# Patient Record
Sex: Female | Born: 1960 | Race: White | Hispanic: Yes | Marital: Married | Smoking: Never smoker
Health system: Southern US, Community
[De-identification: ages and names within clinical notes are randomized; demographics above are authoritative.]

## PROBLEM LIST (undated history)

## (undated) HISTORY — PX: ABDOMINAL HYSTERECTOMY: SHX81

## (undated) HISTORY — PX: COLONOSCOPY: SHX174

---

## 2005-08-24 ENCOUNTER — Ambulatory Visit: Payer: Self-pay | Admitting: Family Medicine

## 2008-06-10 ENCOUNTER — Other Ambulatory Visit: Payer: Self-pay

## 2008-06-10 ENCOUNTER — Emergency Department: Payer: Self-pay | Admitting: Emergency Medicine

## 2008-06-12 ENCOUNTER — Ambulatory Visit: Payer: Self-pay | Admitting: Emergency Medicine

## 2008-07-19 ENCOUNTER — Ambulatory Visit: Payer: Self-pay | Admitting: Family Medicine

## 2009-12-08 ENCOUNTER — Ambulatory Visit: Payer: Self-pay | Admitting: Family Medicine

## 2012-01-25 ENCOUNTER — Ambulatory Visit: Payer: Self-pay | Admitting: Obstetrics and Gynecology

## 2014-06-10 DIAGNOSIS — N814 Uterovaginal prolapse, unspecified: Secondary | ICD-10-CM | POA: Insufficient documentation

## 2014-08-21 ENCOUNTER — Ambulatory Visit: Payer: Self-pay | Admitting: Obstetrics and Gynecology

## 2014-12-16 ENCOUNTER — Ambulatory Visit: Payer: Self-pay | Admitting: Obstetrics and Gynecology

## 2014-12-20 ENCOUNTER — Ambulatory Visit: Payer: Self-pay | Admitting: Obstetrics and Gynecology

## 2015-02-03 LAB — SURGICAL PATHOLOGY

## 2015-02-09 NOTE — Op Note (Signed)
PATIENT NAME:  Valerie [name], Valerie [name] MR#:  [id number] DATE OF BIRTH:  1961/04/08  DATE OF PROCEDURE:  12/20/2014  PREOPERATIVE DIAGNOSES: Uterine descensus.   POSTOPERATIVE DIAGNOSES: Uterine descensus.   PROCEDURE:  1. Total vaginal hysterectomy.  2. Bilateral salpingectomy.  3. Retrograde filling of the bladder.  4. Cystoscopy.   ANESTHESIA: General endotracheal anesthesia.   SURGEON: Suzy Bouchardhomas J. Schermerhorn, M.D.   FIRST ASSISTANT: Dalbert GarnetBeasley.   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 The patient is status post 2 vaginal births.   PROCEDURE: After adequate general endotracheal anesthesia, the patient was placed in the dorsal supine position with the legs placed in the candy cane stirrups. Abdomen, perineum, and vagina were prepped and draped in normal sterile fashion. The patient previously received 2 grams intravenous cefoxitin prior to commencement of the case. The patient was sterilely draped and a red Robinson catheter was placed into the bladder, yielding 150 mL clear urine. A weighted speculum was placed in the posterior vaginal vault and sidewall retractors were placed laterally and Sims retractor was placed anteriorly and the cervix was grasped with 2 thyroid tenacula. The cervix was circumferentially injected with 1% lidocaine with 1:100,000 epinephrine. A direct posterior colpotomy incision was made. Upon entry into the posterior cul-de-sac, the uterosacral ligaments were bilaterally clamped, transected, and suture ligated with 0 Vicryl suture and tagged for later identification. The lateral and anterior cervix was incised with the Bovie and the cardinal ligaments were then bilaterally clamped, transected, and suture ligated with 0 Vicryl suture. The anterior cul-de-sac was not entered with ease. There was much adhesion between the bladder and the anterior portion of the cervix, therefore, serial bites were performed laterally,  transected, and suture ligated with 0 Vicryl suture. Ultimately, the uterus was then flipped posteriorly in a Doderlein maneuver and the uterine cornua were bilaterally clamped, transected, suture ligated and the broad ligament was then sequentially clamped bilaterally down to the level of the uterine arteries, which were ultimately bilaterally clamped, transected, and suture ligated. Throughout the case, uterine sound was placed into the bladder and aided in the position of the bladder. Ultimately, the cervix and uterus were re-flipped back into anatomic position and sequential broad ligament bites were made and ultimately the uterus was delivered without difficulty. The fallopian tubes were both identified, grasped with Babcock clamps, and a Kelly clamp was placed over each fallopian tube and was removed with Metzenbaum scissors. Each pedicle was ligated with 0 Vicryl suture. Good hemostasis was noted. Each ovary appeared normal. Given the amount of dissection and manipulation of the bladder, the bladder was then filled in a retrograde fashion with methylene blue and sterile water, 300 mL was placed and no leakage of methylene blue was noted into the vagina. This was aided by the use of a sponge stick. Of note, before the retrograde procedure was performed, a re-catheterization yielded clear urine. At this point, the cystoscopy was brought up to the operative field to evaluate ureteral function. Bladder was filled with sterile saline 300 mL and 20 mL of methylene blue was administered intravenously while surgeon evaluated the ureteral ostia. An additional 20 mL was utilized, still with no efflux, even though normal clear urine was noted peristalsing through the ureters. 10 mg of Lasix was administered as well to aid in pushing the methylene blue into the kidneys and thus into the bladder. This was still unsuccessful. 1 mL of Fluorescein was then administered intravenously and within  2 minutes, prompt flow of  Fluorescein was identified and efflux seen from both ureteral ostia. Cystoscope was removed and a Foley catheter was replaced. The peritoneum was then closed with a 2-0 PDS suture in a pursestring fashion after assuring all pedicles were hemostatic. The vaginal cuff was then closed with the running 0 Vicryl suture with good approximation of edges. Good hemostasis was noted. Again, the Foley catheter was attached with normal bright yellow, Fluorescein-stained urine noted.   ESTIMATED BLOOD LOSS: 350 mL.   URINE OUTPUT: Approximately 200 mL.   INTRAOPERATIVE FLUIDS: 2000 mL.  The patient did tolerate the procedure well and was taken to the recovery room in good condition.  ____________________________ Suzy Bouchard, MD tjs:ap D: 12/20/2014 14:17:58 ET T: 12/20/2014 21:33:15 ET JOB#: [id number]  cc: Suzy Bouchard, MD, <Dictator> Suzy Bouchard MD ELECTRONICALLY SIGNED 01/01/2015 8:46

## 2015-07-21 ENCOUNTER — Other Ambulatory Visit: Payer: Self-pay | Admitting: Obstetrics and Gynecology

## 2015-07-21 DIAGNOSIS — Z1231 Encounter for screening mammogram for malignant neoplasm of breast: Secondary | ICD-10-CM

## 2015-08-27 ENCOUNTER — Ambulatory Visit
Admission: RE | Admit: 2015-08-27 | Discharge: 2015-08-27 | Disposition: A | Discharge status: Home / Self Care | Payer: PRIVATE HEALTH INSURANCE | Source: Ambulatory Visit | Attending: Obstetrics and Gynecology | Admitting: Obstetrics and Gynecology

## 2015-08-27 DIAGNOSIS — Z1231 Encounter for screening mammogram for malignant neoplasm of breast: Secondary | ICD-10-CM | POA: Insufficient documentation

## 2015-08-29 ENCOUNTER — Other Ambulatory Visit: Payer: Self-pay | Admitting: Obstetrics and Gynecology

## 2015-08-29 DIAGNOSIS — R928 Other abnormal and inconclusive findings on diagnostic imaging of breast: Secondary | ICD-10-CM

## 2015-09-12 ENCOUNTER — Ambulatory Visit
Admission: RE | Admit: 2015-09-12 | Discharge: 2015-09-12 | Disposition: A | Discharge status: Home / Self Care | Payer: PRIVATE HEALTH INSURANCE | Source: Ambulatory Visit | Attending: Obstetrics and Gynecology | Admitting: Obstetrics and Gynecology

## 2015-09-12 DIAGNOSIS — R928 Other abnormal and inconclusive findings on diagnostic imaging of breast: Secondary | ICD-10-CM

## 2015-09-22 ENCOUNTER — Ambulatory Visit: Payer: Self-pay | Admitting: Physician Assistant

## 2015-09-22 VITALS — BP 110/65 | HR 106 | Temp 98.0°F

## 2015-09-22 DIAGNOSIS — J069 Acute upper respiratory infection, unspecified: Secondary | ICD-10-CM

## 2015-09-22 MED ORDER — FLUCONAZOLE 150 MG PO TABS: 150.0000 mg | ORAL_TABLET | Freq: Once | ORAL | Status: DC

## 2015-09-22 MED ORDER — FLUTICASONE PROPIONATE 50 MCG/ACT NA SUSP: 2.0000 | Freq: Every day | NASAL | Status: DC

## 2015-09-22 MED ORDER — AZITHROMYCIN 250 MG PO TABS: ORAL_TABLET | ORAL | Status: DC

## 2015-09-22 NOTE — Progress Notes (Signed)
S: C/o runny nose and congestion for 3 days, no fever, chills, cp/sob, v/d; mucus is green and thick, cough is sporadic, c/o of facial and dental pain. Also Saturday started with anal itching, is relieved by preparation H, no bleeding with bm;   Using otc meds:   O: PE: perrl eomi, normocephalic, tms dull, nasal mucosa red and swollen, throat injected, neck supple no lymph, lungs c t a, cv rrr, neuro intact, rectal exam deferred by pt  A:  Acute sinusitis, anal itching   P:zpack, flonase, diflucan; continue prep H;  drink fluids, continue regular meds , use otc meds of choice, return if not improving in 5 days, return earlier if worsening

## 2016-04-01 ENCOUNTER — Ambulatory Visit: Payer: Self-pay | Admitting: Physician Assistant

## 2016-04-01 ENCOUNTER — Encounter: Payer: Self-pay | Admitting: Physician Assistant

## 2016-04-01 VITALS — BP 100/60 | HR 98 | Temp 98.6°F

## 2016-04-01 DIAGNOSIS — J069 Acute upper respiratory infection, unspecified: Secondary | ICD-10-CM

## 2016-04-01 MED ORDER — FLUTICASONE PROPIONATE 50 MCG/ACT NA SUSP: 2.0000 | Freq: Every day | NASAL | Status: DC

## 2016-04-01 MED ORDER — AZITHROMYCIN 250 MG PO TABS: ORAL_TABLET | ORAL | Status: DC

## 2016-04-01 NOTE — Progress Notes (Signed)
S: C/o runny nose and congestion for 3 days, no fever, chills, cp/sob, v/d; mucus is yellow and thick, cough is sporadic, c/o of facial and dental pain.   Using otc meds:   O: PE:  Vitals wnl, nad, perrl eomi, normocephalic, tms dull, nasal mucosa red and swollen, throat injected, neck supple no lymph, lungs c t a, cv rrr, neuro intact  A:  Acute sinusitis   P: zpack, flonase, drink fluids, continue regular meds , use otc meds of choice, return if not improving in 5 days, return earlier if worsening

## 2016-07-02 ENCOUNTER — Other Ambulatory Visit: Payer: Self-pay

## 2016-07-02 ENCOUNTER — Encounter (INDEPENDENT_AMBULATORY_CARE_PROVIDER_SITE_OTHER): Payer: Self-pay

## 2016-07-02 DIAGNOSIS — Z299 Encounter for prophylactic measures, unspecified: Secondary | ICD-10-CM

## 2016-07-02 NOTE — Progress Notes (Signed)
Patient came in to have blood drawn for testing per Dr. Francesca OmanSchermerhorn's orders.

## 2016-07-03 LAB — CMP12+LP+TP+TSH+6AC+CBC/D/PLT
ALBUMIN: 4.2 g/dL (ref 3.5–5.5)
ALT: 33 IU/L — AB (ref 0–32)
AST: 30 IU/L (ref 0–40)
Albumin/Globulin Ratio: 1.6 (ref 1.2–2.2)
Alkaline Phosphatase: 52 IU/L (ref 39–117)
BASOS ABS: 0 10*3/uL (ref 0.0–0.2)
BILIRUBIN TOTAL: 0.4 mg/dL (ref 0.0–1.2)
BUN/Creatinine Ratio: 14 (ref 9–23)
BUN: 10 mg/dL (ref 6–24)
Basos: 1 %
CHLORIDE: 102 mmol/L (ref 96–106)
CHOLESTEROL TOTAL: 296 mg/dL — AB (ref 100–199)
Calcium: 9.3 mg/dL (ref 8.7–10.2)
Chol/HDL Ratio: 7.8 ratio units — ABNORMAL HIGH (ref 0.0–4.4)
Creatinine, Ser: 0.71 mg/dL (ref 0.57–1.00)
EOS (ABSOLUTE): 0.1 10*3/uL (ref 0.0–0.4)
ESTIMATED CHD RISK: 2.2 times avg. — AB (ref 0.0–1.0)
Eos: 2 %
FREE THYROXINE INDEX: 1.7 (ref 1.2–4.9)
GFR calc Af Amer: 112 mL/min/{1.73_m2} (ref >59)
GFR calc non Af Amer: 98 mL/min/{1.73_m2} (ref >59)
GGT: 19 IU/L (ref 0–60)
Globulin, Total: 2.6 g/dL (ref 1.5–4.5)
Glucose: 104 mg/dL — ABNORMAL HIGH (ref 65–99)
HDL: 38 mg/dL — ABNORMAL LOW (ref >39)
Hematocrit: 40.7 % (ref 34.0–46.6)
Hemoglobin: 13.8 g/dL (ref 11.1–15.9)
IMMATURE GRANULOCYTES: 0 %
IRON: 90 ug/dL (ref 27–159)
Immature Grans (Abs): 0 10*3/uL (ref 0.0–0.1)
LDH: 177 IU/L (ref 119–226)
LDL Calculated: 217 mg/dL — ABNORMAL HIGH (ref 0–99)
LYMPHS ABS: 1.4 10*3/uL (ref 0.7–3.1)
Lymphs: 27 %
MCH: 29.2 pg (ref 26.6–33.0)
MCHC: 33.9 g/dL (ref 31.5–35.7)
MCV: 86 fL (ref 79–97)
MONOS ABS: 0.3 10*3/uL (ref 0.1–0.9)
Monocytes: 5 %
NEUTROS PCT: 65 %
Neutrophils Absolute: 3.4 10*3/uL (ref 1.4–7.0)
PLATELETS: 228 10*3/uL (ref 150–379)
Phosphorus: 3 mg/dL (ref 2.5–4.5)
Potassium: 5.3 mmol/L — ABNORMAL HIGH (ref 3.5–5.2)
RBC: 4.73 x10E6/uL (ref 3.77–5.28)
RDW: 13.2 % (ref 12.3–15.4)
SODIUM: 139 mmol/L (ref 134–144)
T3 UPTAKE RATIO: 24 % (ref 24–39)
T4, Total: 6.9 ug/dL (ref 4.5–12.0)
TOTAL PROTEIN: 6.8 g/dL (ref 6.0–8.5)
TSH: 1.55 u[IU]/mL (ref 0.450–4.500)
Triglycerides: 206 mg/dL — ABNORMAL HIGH (ref 0–149)
Uric Acid: 4.1 mg/dL (ref 2.5–7.1)
VLDL Cholesterol Cal: 41 mg/dL — ABNORMAL HIGH (ref 5–40)
WBC: 5.2 10*3/uL (ref 3.4–10.8)

## 2016-07-03 LAB — URINALYSIS
BILIRUBIN UA: NEGATIVE
GLUCOSE, UA: NEGATIVE
Ketones, UA: NEGATIVE
Leukocytes, UA: NEGATIVE
Nitrite, UA: NEGATIVE
PH UA: 7 (ref 5.0–7.5)
Protein, UA: NEGATIVE
RBC UA: NEGATIVE
Specific Gravity, UA: <=1.005 — AB (ref 1.005–1.030)
UUROB: 0.2 mg/dL (ref 0.2–1.0)

## 2016-07-06 ENCOUNTER — Encounter: Payer: Self-pay | Admitting: Emergency Medicine

## 2016-09-16 ENCOUNTER — Other Ambulatory Visit: Payer: Self-pay | Admitting: Obstetrics and Gynecology

## 2016-09-16 DIAGNOSIS — Z1231 Encounter for screening mammogram for malignant neoplasm of breast: Secondary | ICD-10-CM

## 2016-10-25 ENCOUNTER — Ambulatory Visit
Admission: RE | Admit: 2016-10-25 | Discharge: 2016-10-25 | Disposition: A | Discharge status: Home / Self Care | Payer: Managed Care, Other (non HMO) | Source: Ambulatory Visit | Attending: Obstetrics and Gynecology | Admitting: Obstetrics and Gynecology

## 2016-10-25 DIAGNOSIS — Z1231 Encounter for screening mammogram for malignant neoplasm of breast: Secondary | ICD-10-CM | POA: Insufficient documentation

## 2016-10-25 DIAGNOSIS — R928 Other abnormal and inconclusive findings on diagnostic imaging of breast: Secondary | ICD-10-CM | POA: Diagnosis not present

## 2016-10-26 ENCOUNTER — Other Ambulatory Visit: Payer: Self-pay | Admitting: Obstetrics and Gynecology

## 2016-10-26 DIAGNOSIS — R928 Other abnormal and inconclusive findings on diagnostic imaging of breast: Secondary | ICD-10-CM

## 2016-11-12 ENCOUNTER — Ambulatory Visit
Admission: RE | Admit: 2016-11-12 | Discharge: 2016-11-12 | Disposition: A | Discharge status: Home / Self Care | Payer: Managed Care, Other (non HMO) | Source: Ambulatory Visit | Attending: Obstetrics and Gynecology | Admitting: Obstetrics and Gynecology

## 2016-11-12 DIAGNOSIS — R928 Other abnormal and inconclusive findings on diagnostic imaging of breast: Secondary | ICD-10-CM

## 2016-11-12 DIAGNOSIS — N6322 Unspecified lump in the left breast, upper inner quadrant: Secondary | ICD-10-CM | POA: Diagnosis not present

## 2017-01-17 ENCOUNTER — Other Ambulatory Visit: Payer: Self-pay | Admitting: Obstetrics and Gynecology

## 2017-01-17 DIAGNOSIS — N63 Unspecified lump in unspecified breast: Secondary | ICD-10-CM

## 2017-01-20 ENCOUNTER — Other Ambulatory Visit: Payer: Self-pay | Admitting: Physician Assistant

## 2017-01-20 DIAGNOSIS — J069 Acute upper respiratory infection, unspecified: Secondary | ICD-10-CM

## 2017-01-24 ENCOUNTER — Other Ambulatory Visit: Payer: Self-pay | Admitting: Physician Assistant

## 2017-01-24 DIAGNOSIS — J069 Acute upper respiratory infection, unspecified: Secondary | ICD-10-CM

## 2017-01-24 NOTE — Telephone Encounter (Signed)
Med refill for flonase approved 

## 2017-01-27 ENCOUNTER — Other Ambulatory Visit: Payer: Self-pay | Admitting: Emergency Medicine

## 2017-01-27 DIAGNOSIS — J069 Acute upper respiratory infection, unspecified: Secondary | ICD-10-CM

## 2017-01-27 MED ORDER — FLUTICASONE PROPIONATE 50 MCG/ACT NA SUSP: NASAL | 12 refills | Status: DC

## 2017-01-31 ENCOUNTER — Other Ambulatory Visit: Payer: Self-pay | Admitting: Emergency Medicine

## 2017-05-17 ENCOUNTER — Ambulatory Visit: Payer: PRIVATE HEALTH INSURANCE

## 2017-05-17 ENCOUNTER — Other Ambulatory Visit: Payer: Self-pay

## 2017-06-17 ENCOUNTER — Ambulatory Visit
Admission: RE | Admit: 2017-06-17 | Discharge: 2017-06-17 | Disposition: A | Discharge status: Home / Self Care | Payer: Managed Care, Other (non HMO) | Source: Ambulatory Visit | Attending: Obstetrics and Gynecology | Admitting: Obstetrics and Gynecology

## 2017-06-17 DIAGNOSIS — N63 Unspecified lump in unspecified breast: Secondary | ICD-10-CM

## 2017-06-17 DIAGNOSIS — N6322 Unspecified lump in the left breast, upper inner quadrant: Secondary | ICD-10-CM | POA: Diagnosis not present

## 2017-07-27 IMAGING — US US BREAST*L* LIMITED INC AXILLA
1 series · 7 of 7 positions shown · non-contrast
Comparison: Previous exam(s).

CLINICAL DATA: Focal asymmetry seen in the left breast slightly
lower inner quadrant, middle depth seen on most recent screening
mammography.

EXAM:
2D DIGITAL DIAGNOSTIC LEFT MAMMOGRAM WITH CAD AND ADJUNCT TOMO
ULTRASOUND LEFT BREAST

[Series 1: us breast*left* limited inc axilla · 0.04mm/px · 7 of 7 slices shown]
[im 1/7]
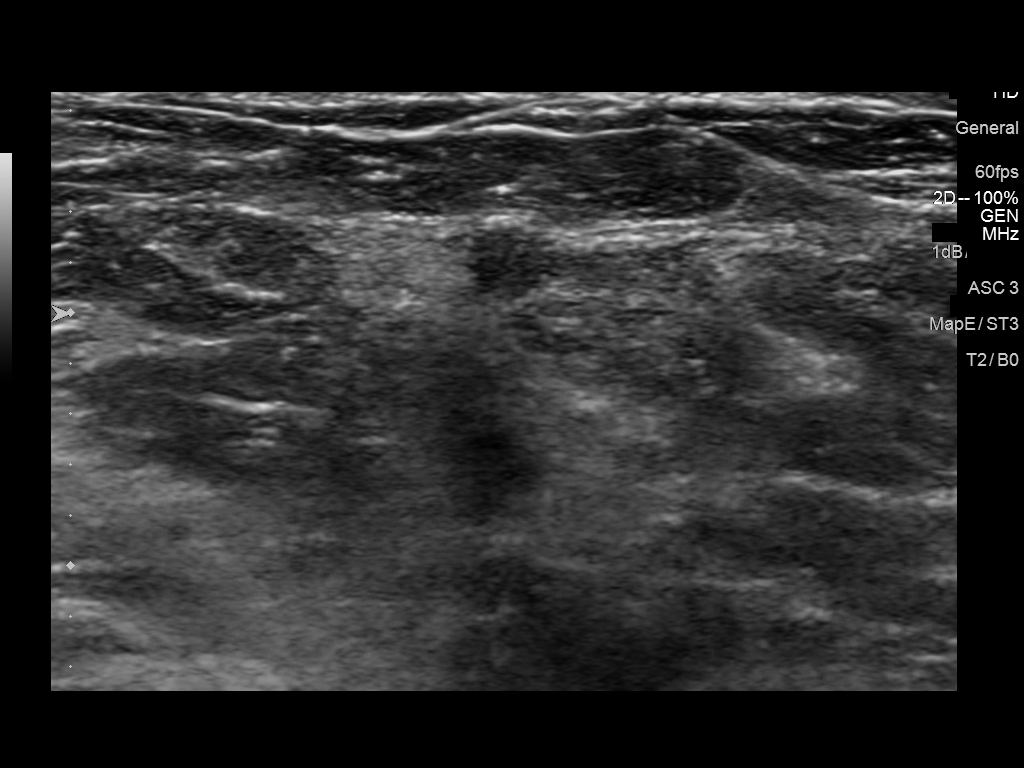
[im 2/7]
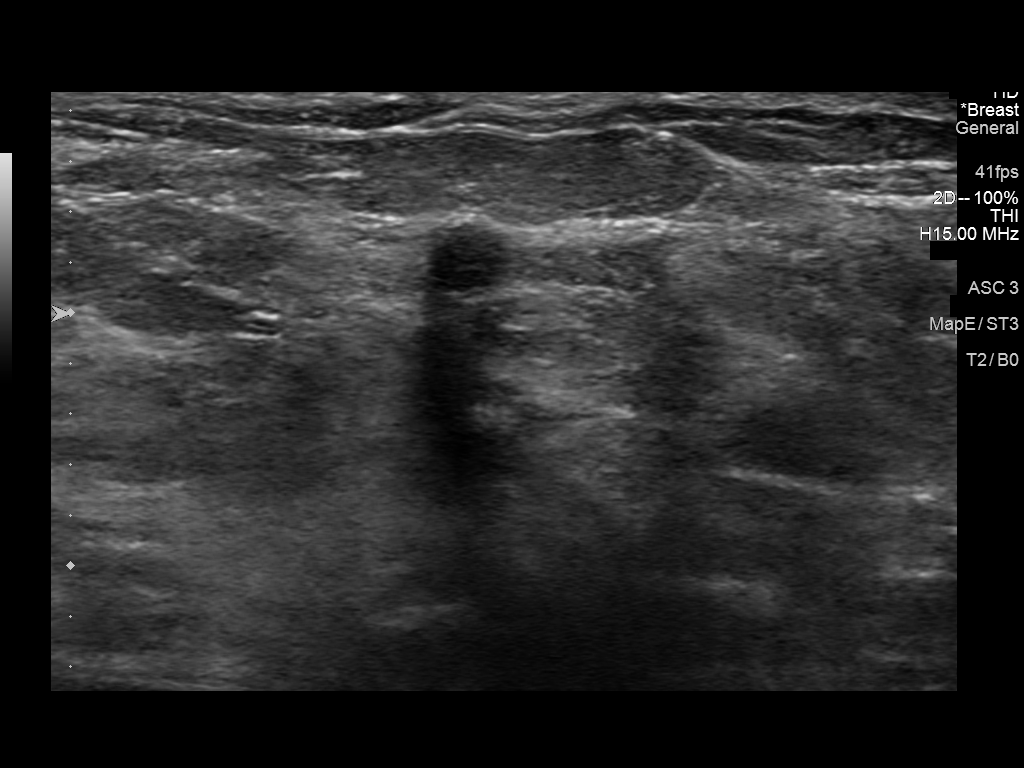
[im 3/7]
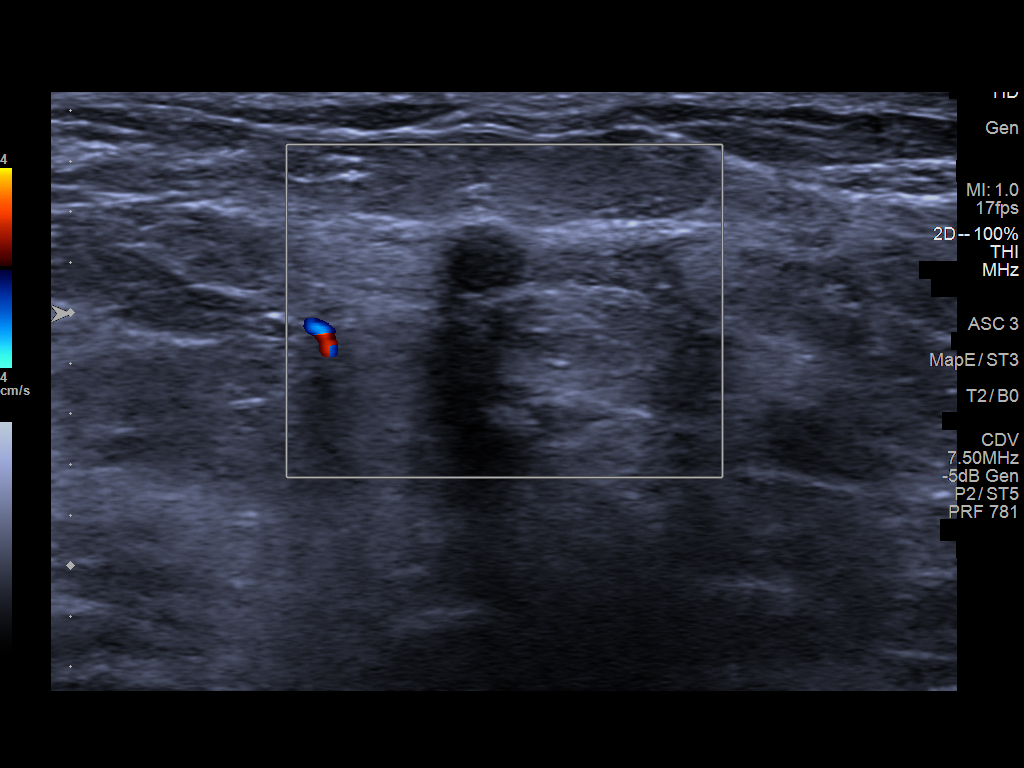
[im 4/7]
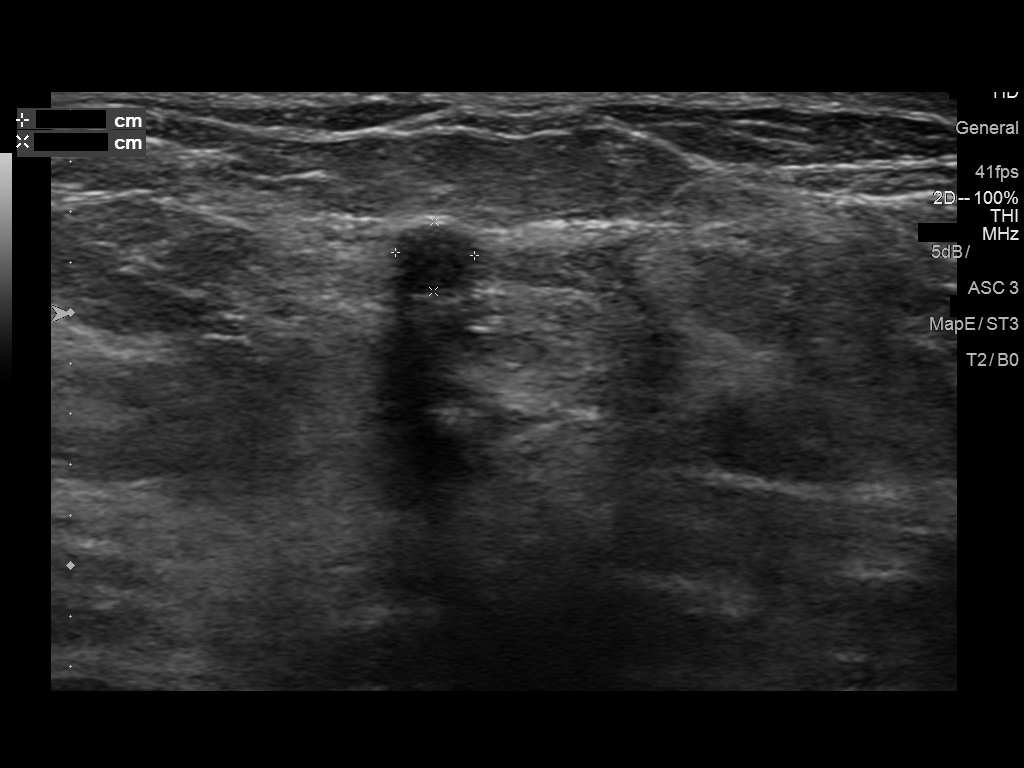
[im 5/7]
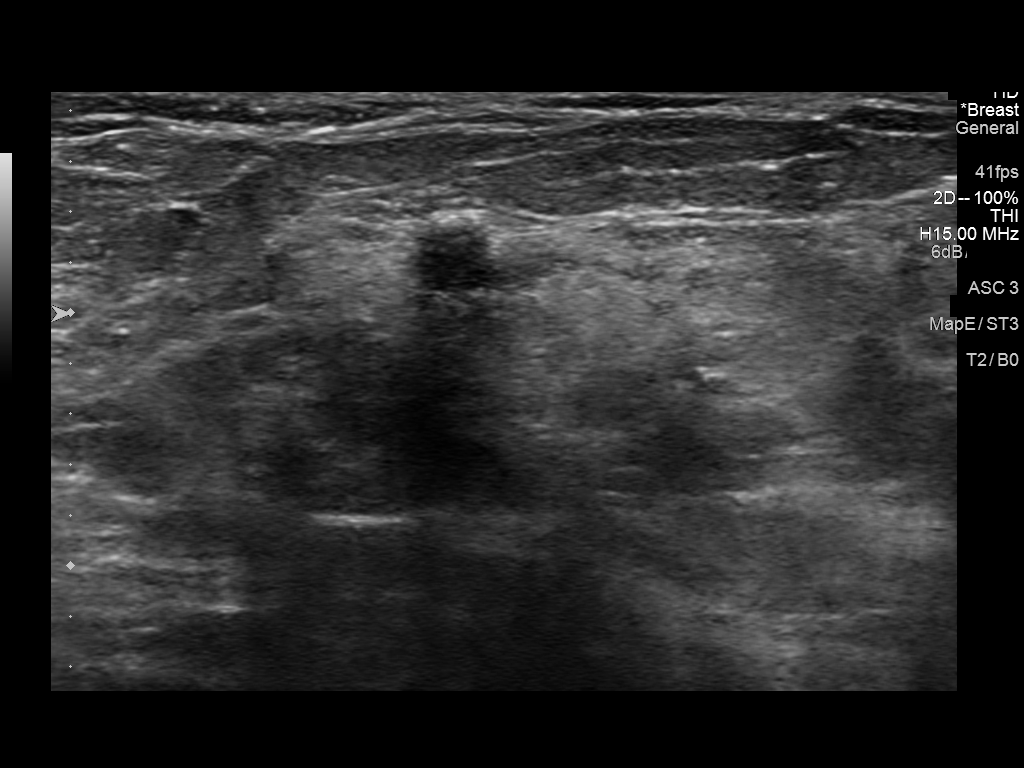
[im 6/7]
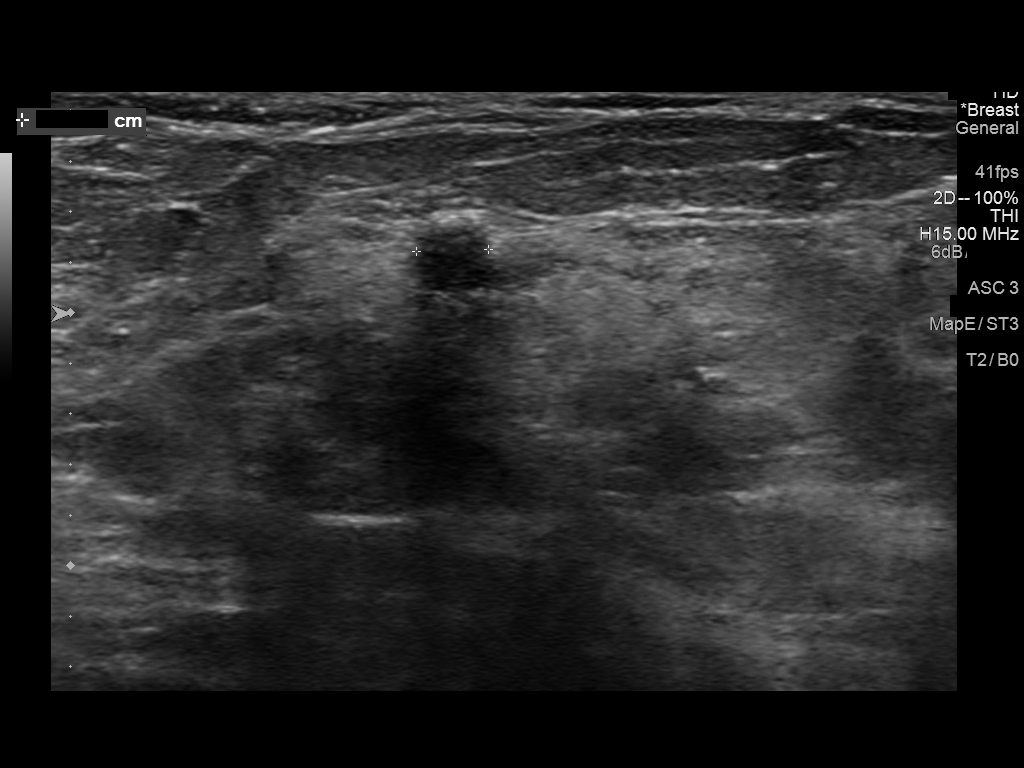
[im 7/7]
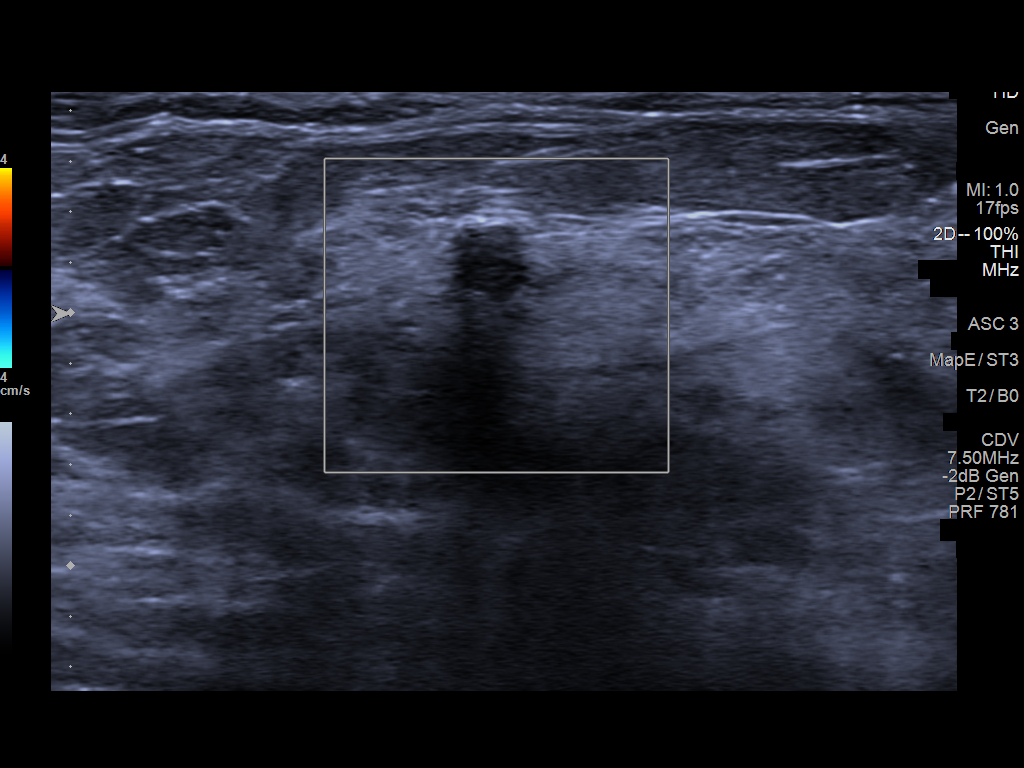

[7 of 7 positions shown; findings below may reference images not displayed]

ACR Breast Density Category c: The breast tissue is heterogeneously
dense, which may obscure small masses.
FINDINGS: Additional mammographic views of the left breast demonstrate
interval effacement of previously seen distortion in the slightly
lower inner left breast. No suspicious masses are seen.

Mammographic images were processed with CAD.

On physical exam, no suspicious masses are palpated.

Targeted ultrasound is performed, showing left breast 10 o'clock 4
cm from the nipple hypoechoic circumscribed nodule measuring 0.3 x
0.3 x 0.3 cm.
IMPRESSION: Left breast 10 o'clock probably benign nodule, for which six-month
follow-up is recommended.

RECOMMENDATION:
Diagnostic mammogram and possibly ultrasound of the left breast in 6
months. (Code:G8-S-IIQ)

I have discussed the findings and recommendations with the patient.
Results were also provided in writing at the conclusion of the
visit. If applicable, a reminder letter will be sent to the patient
regarding the next appointment.

BI-RADS CATEGORY  3: Probably benign.

## 2017-12-23 ENCOUNTER — Ambulatory Visit: Payer: Self-pay | Admitting: Family Medicine

## 2017-12-23 VITALS — BP 115/78 | HR 92 | Temp 98.9°F | Resp 18

## 2017-12-23 DIAGNOSIS — J329 Chronic sinusitis, unspecified: Secondary | ICD-10-CM

## 2017-12-23 MED ORDER — AMOXICILLIN-POT CLAVULANATE 875-125 MG PO TABS: 1.0000 | ORAL_TABLET | Freq: Two times a day (BID) | ORAL | 0 refills | Status: AC

## 2017-12-23 NOTE — Progress Notes (Signed)
Subjective: congestion     Valerie [name] is a 57 y.o. female who presents for evaluation of nasal congestion with purulent discharge, cough with purulent sputum, sore throat, chills, facial pressure, and low-grade fever since Tuesday morning.  Reports fever has not been higher than 101.  Patient reports she initially thought she was getting better but that she has had a fever for 4 days.  Patient is otherwise healthy and denies any medical history.  Patient is not on any medications.  Denies history of allergic rhinitis, COPD, asthma, or smoking. Treatment to date: None.  Denies rash, nausea, vomiting, diarrhea, shortness of breath, wheezing, chest or back pain, ear pain, difficulty swallowing, confusion, headache, body aches, or fatigue.   History of recurrent sinus and/or lung infections: None. Antibiotic use in the last month: None.   Review of Systems Pertinent items noted in HPI and remainder of comprehensive ROS otherwise negative.     Objective:   Physical Exam General: Awake, alert, and oriented. No acute distress. Well developed, hydrated and nourished. Appears stated age. Nontoxic appearance.  HEENT:  PND noted.  Mild erythema to posterior oropharynx.  No edema or exudates of pharynx or tonsils. No erythema or bulging of TM.  Mild erythema/edema to nasal mucosa. Sinuses nontender. Supple neck without adenopathy. Cardiac: Heart rate and rhythm are normal. No murmurs, gallops, or rubs are auscultated. S1 and S2 are heard and are of normal intensity.  Respiratory: No signs of respiratory distress. Lungs clear. No tachypnea. Able to speak in full sentences without dyspnea. Nonlabored respirations.  Skin: Skin is warm, dry and intact. Appropriate color for ethnicity. No cyanosis noted.   Diagnostic Results: None.  Assessment:    viral upper respiratory illness   Plan:    Discussed diagnosis and treatment of URI. Discussed the importance of avoiding unnecessary antibiotic  therapy. Suggested symptomatic OTC remedies. Nasal saline spray for congestion.   Provided patient with a delayed antibiotic prescription and gave her specific indications to refill this only if her fever persists over the next few days, severe symptoms, "double sickening", or no improvement in symptoms in 10 days.  Patient verbally agreed.  Side/adverse effects of antibiotics discussed. Discussed red flag symptoms and circumstances with which to seek medical care.   New Prescriptions   AMOXICILLIN-CLAVULANATE (AUGMENTIN) 875-125 MG TABLET    Take 1 tablet by mouth 2 (two) times daily for 10 days.

## 2018-01-17 ENCOUNTER — Other Ambulatory Visit: Payer: Self-pay | Admitting: Obstetrics and Gynecology

## 2018-01-17 DIAGNOSIS — N6320 Unspecified lump in the left breast, unspecified quadrant: Secondary | ICD-10-CM

## 2018-02-01 ENCOUNTER — Other Ambulatory Visit: Payer: Self-pay

## 2018-02-01 DIAGNOSIS — Z01419 Encounter for gynecological examination (general) (routine) without abnormal findings: Secondary | ICD-10-CM

## 2018-02-01 IMAGING — MG MM DIGITAL DIAGNOSTIC UNILAT*L* W/ TOMO W/ CAD
6 series · 6 of 14 positions shown · non-contrast
Comparison: Previous exam(s).

CLINICAL DATA: Focal asymmetry seen in the left breast slightly
lower inner quadrant, middle depth seen on most recent screening
mammography.

EXAM:
2D DIGITAL DIAGNOSTIC LEFT MAMMOGRAM WITH CAD AND ADJUNCT TOMO
ULTRASOUND LEFT BREAST

[L CC]
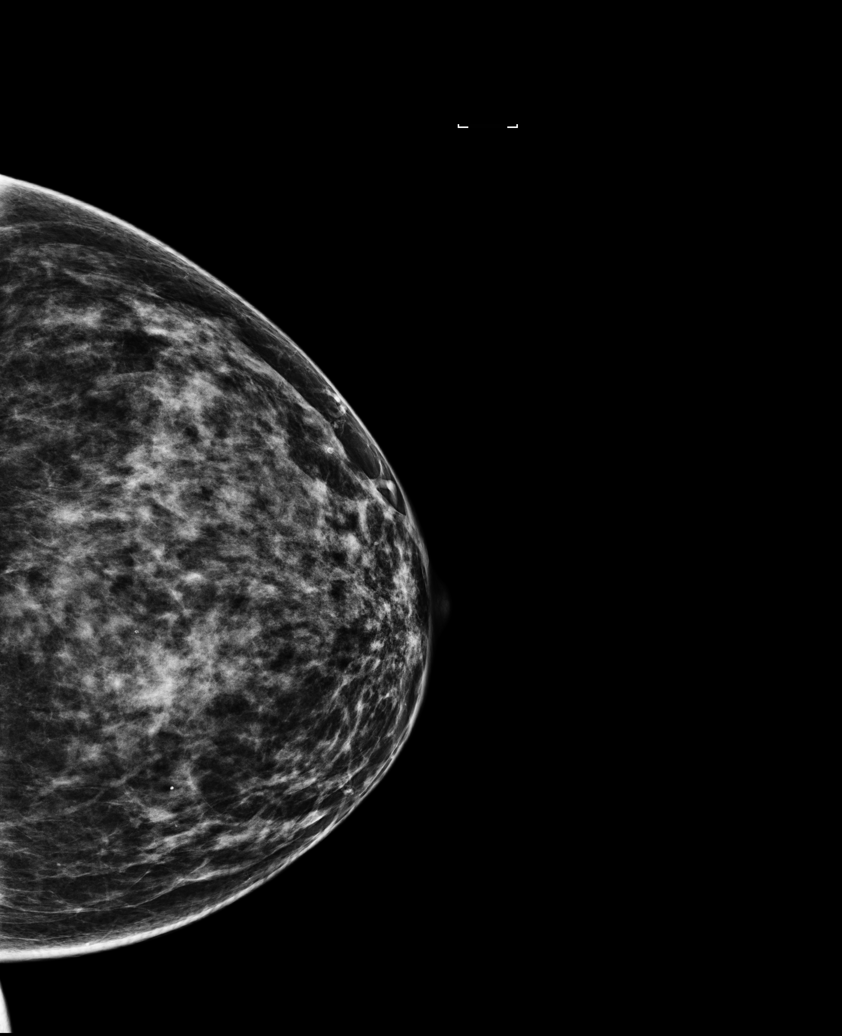

[L MLO]
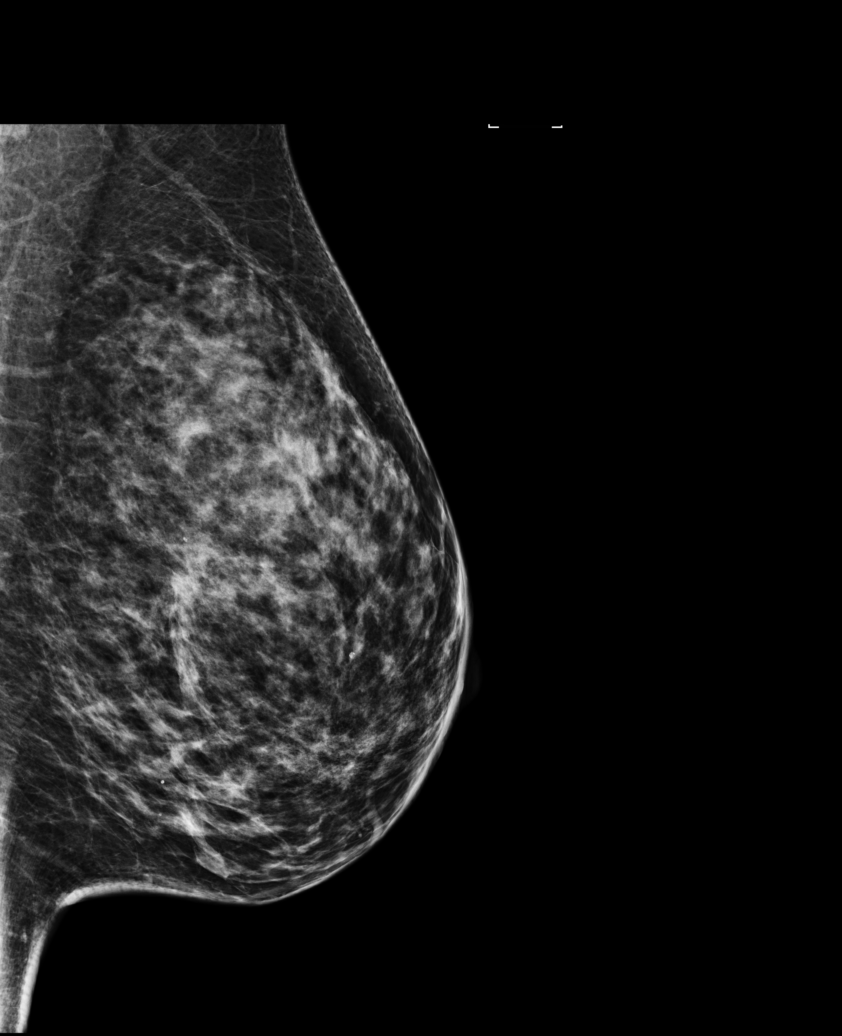

[L MLO synth-2D]
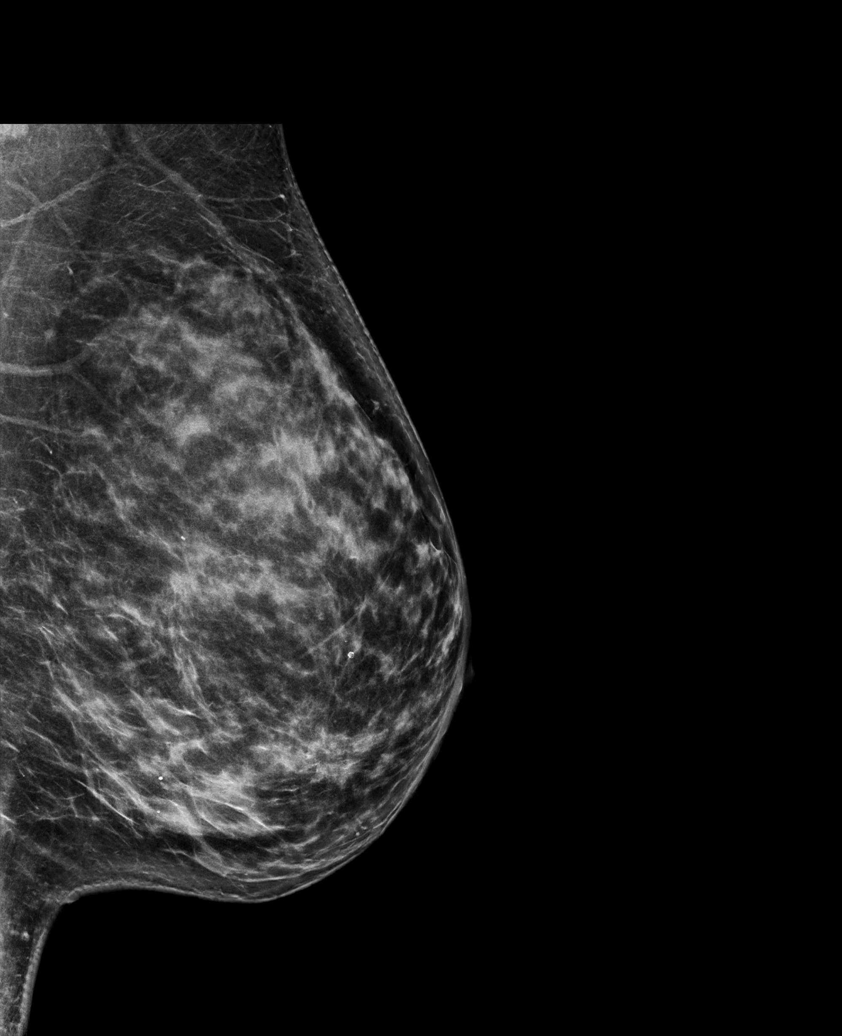

[L CC synth-2D]
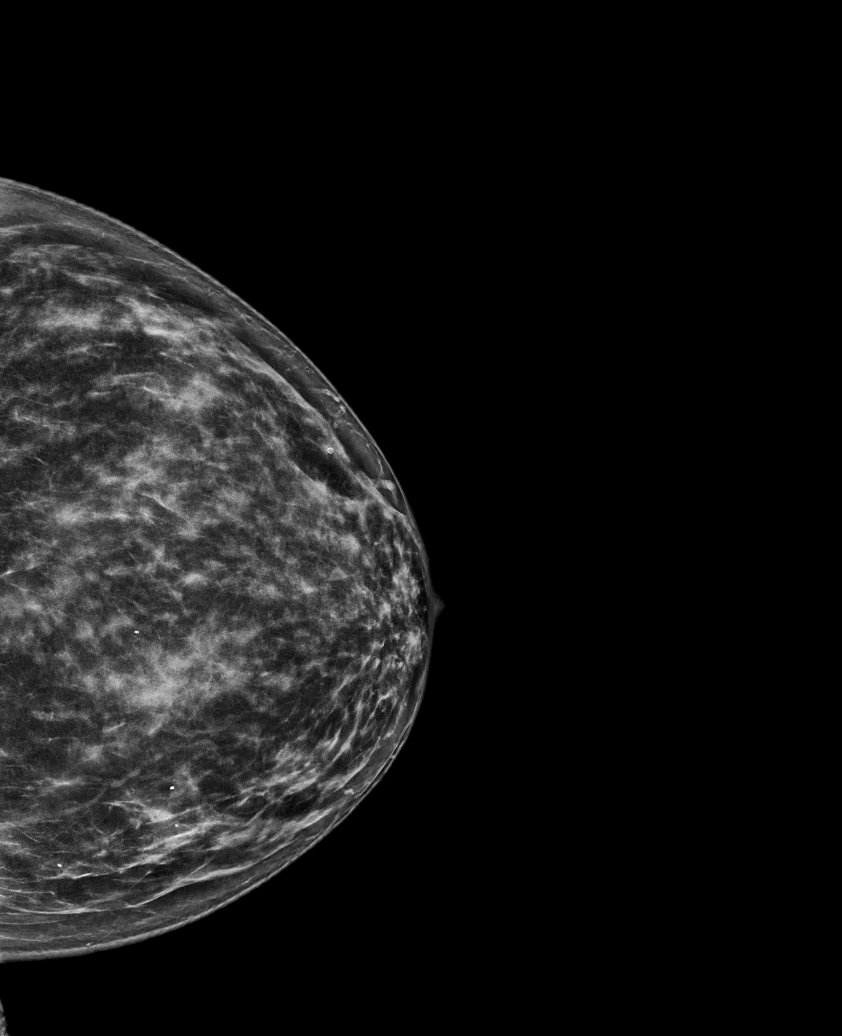

[L CC tomo · tomo slice 37/73.0]
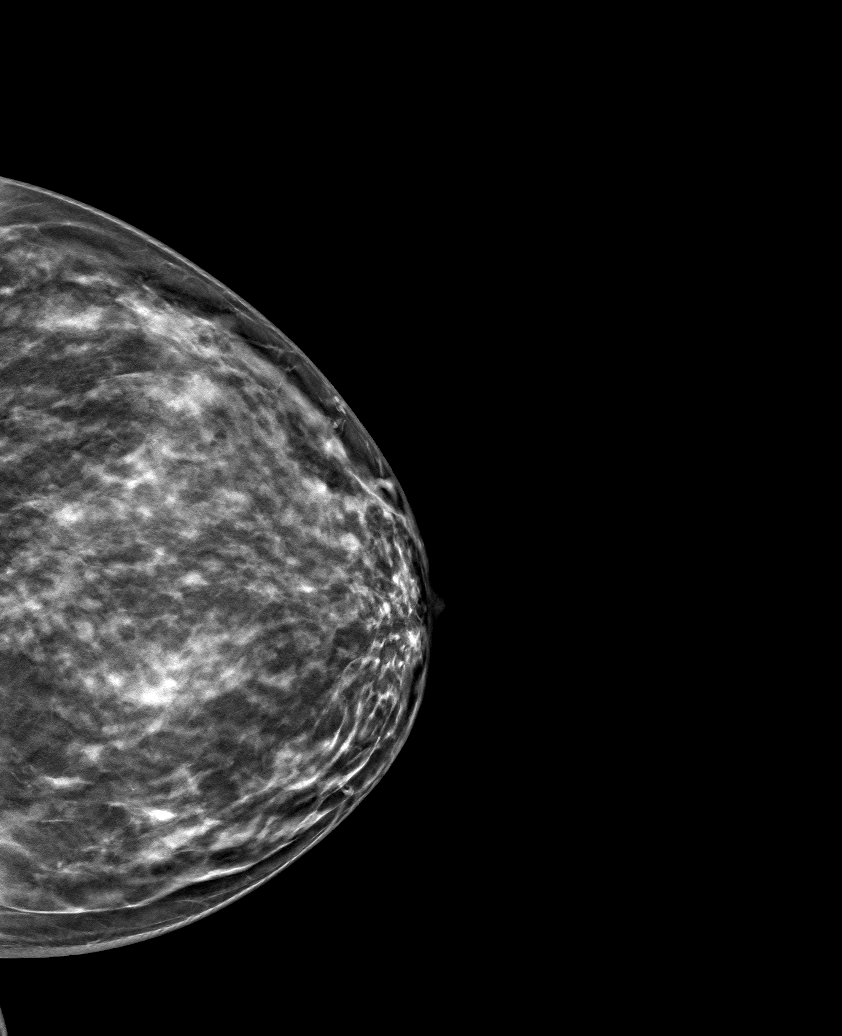

[L MLO tomo · tomo slice 39/78.0]
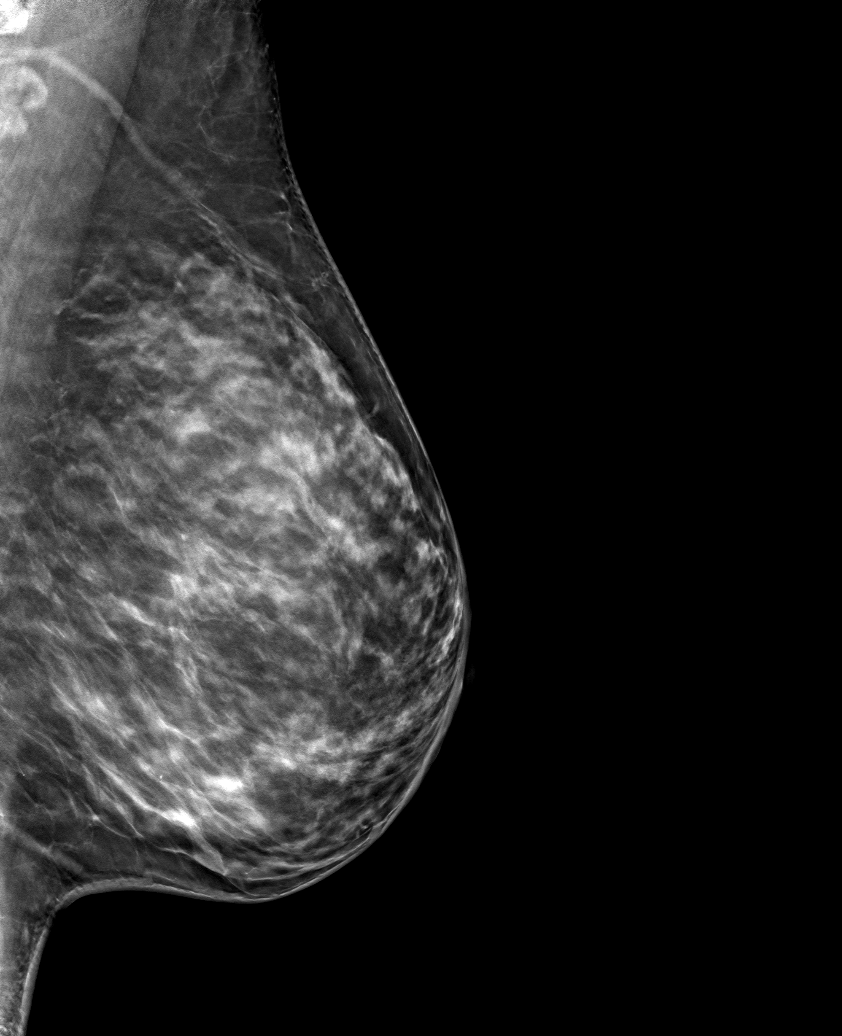

[6 of 14 positions shown; findings below may reference images not displayed]

ACR Breast Density Category c: The breast tissue is heterogeneously
dense, which may obscure small masses.
FINDINGS: Additional mammographic views of the left breast demonstrate
interval effacement of previously seen distortion in the slightly
lower inner left breast. No suspicious masses are seen.

Mammographic images were processed with CAD.

On physical exam, no suspicious masses are palpated.

Targeted ultrasound is performed, showing left breast 10 o'clock 4
cm from the nipple hypoechoic circumscribed nodule measuring 0.3 x
0.3 x 0.3 cm.
IMPRESSION: Left breast 10 o'clock probably benign nodule, for which six-month
follow-up is recommended.

RECOMMENDATION:
Diagnostic mammogram and possibly ultrasound of the left breast in 6
months. (Code:G8-S-IIQ)

I have discussed the findings and recommendations with the patient.
Results were also provided in writing at the conclusion of the
visit. If applicable, a reminder letter will be sent to the patient
regarding the next appointment.

BI-RADS CATEGORY  3: Probably benign.

## 2018-02-02 LAB — URINALYSIS, ROUTINE W REFLEX MICROSCOPIC
BILIRUBIN UA: NEGATIVE
Glucose, UA: NEGATIVE
KETONES UA: NEGATIVE
LEUKOCYTES UA: NEGATIVE
Nitrite, UA: NEGATIVE
PH UA: 7 (ref 5.0–7.5)
PROTEIN UA: NEGATIVE
RBC UA: NEGATIVE
SPEC GRAV UA: 1.018 (ref 1.005–1.030)
UUROB: 0.2 mg/dL (ref 0.2–1.0)

## 2018-02-02 LAB — LIPID PANEL WITH LDL/HDL RATIO
CHOLESTEROL TOTAL: 293 mg/dL — AB (ref 100–199)
HDL: 39 mg/dL — ABNORMAL LOW (ref >39)
LDL CALC: 215 mg/dL — AB (ref 0–99)
LDl/HDL Ratio: 5.5 ratio — ABNORMAL HIGH (ref 0.0–3.2)
TRIGLYCERIDES: 195 mg/dL — AB (ref 0–149)
VLDL Cholesterol Cal: 39 mg/dL (ref 5–40)

## 2018-02-02 LAB — CBC WITH DIFFERENTIAL/PLATELET
BASOS ABS: 0 10*3/uL (ref 0.0–0.2)
Basos: 1 %
EOS (ABSOLUTE): 0.1 10*3/uL (ref 0.0–0.4)
Eos: 2 %
Hematocrit: 39.6 % (ref 34.0–46.6)
Hemoglobin: 13.5 g/dL (ref 11.1–15.9)
Immature Grans (Abs): 0 10*3/uL (ref 0.0–0.1)
Immature Granulocytes: 0 %
LYMPHS ABS: 1.5 10*3/uL (ref 0.7–3.1)
Lymphs: 30 %
MCH: 28.9 pg (ref 26.6–33.0)
MCHC: 34.1 g/dL (ref 31.5–35.7)
MCV: 85 fL (ref 79–97)
MONOCYTES: 5 %
MONOS ABS: 0.3 10*3/uL (ref 0.1–0.9)
NEUTROS ABS: 3.1 10*3/uL (ref 1.4–7.0)
Neutrophils: 62 %
PLATELETS: 220 10*3/uL (ref 150–379)
RBC: 4.67 x10E6/uL (ref 3.77–5.28)
RDW: 14.1 % (ref 12.3–15.4)
WBC: 5 10*3/uL (ref 3.4–10.8)

## 2018-02-02 LAB — BASIC METABOLIC PANEL
BUN / CREAT RATIO: 21 (ref 9–23)
BUN: 16 mg/dL (ref 6–24)
CO2: 27 mmol/L (ref 20–29)
Calcium: 10.3 mg/dL — ABNORMAL HIGH (ref 8.7–10.2)
Chloride: 104 mmol/L (ref 96–106)
Creatinine, Ser: 0.76 mg/dL (ref 0.57–1.00)
GFR calc Af Amer: 102 mL/min/{1.73_m2} (ref >59)
GFR calc non Af Amer: 89 mL/min/{1.73_m2} (ref >59)
GLUCOSE: 110 mg/dL — AB (ref 65–99)
Potassium: 5 mmol/L (ref 3.5–5.2)
SODIUM: 143 mmol/L (ref 134–144)

## 2018-02-06 ENCOUNTER — Other Ambulatory Visit: Payer: Self-pay

## 2018-03-21 ENCOUNTER — Ambulatory Visit
Admission: RE | Admit: 2018-03-21 | Discharge: 2018-03-21 | Disposition: A | Discharge status: Home / Self Care | Payer: Managed Care, Other (non HMO) | Source: Ambulatory Visit | Attending: Obstetrics and Gynecology | Admitting: Obstetrics and Gynecology

## 2018-03-21 DIAGNOSIS — N6320 Unspecified lump in the left breast, unspecified quadrant: Secondary | ICD-10-CM

## 2018-03-23 ENCOUNTER — Ambulatory Visit: Payer: Self-pay | Admitting: Family Medicine

## 2018-03-23 VITALS — BP 122/66 | HR 79 | Resp 16 | Ht 66.0 in | Wt 164.0 lb

## 2018-03-23 DIAGNOSIS — Z0000 Encounter for general adult medical examination without abnormal findings: Secondary | ICD-10-CM

## 2018-03-23 NOTE — Progress Notes (Signed)
Subjective: Annual biometrics screening  Patient presents for her annual biometric screening.  Patient  has already completed her physical exam and biometric labs.  Patient reports eating a healthy, well-rounded diet and getting regular physical activity.  Patient denies any other issues or concerns.   Assessment Annual biometrics screening  Plan  Encouraged routine visits with primary care provider.  Encouraged patient to get regular exercise and eat a healthy, well-rounded diet.

## 2018-05-16 ENCOUNTER — Other Ambulatory Visit: Payer: Self-pay

## 2018-05-16 DIAGNOSIS — E7800 Pure hypercholesterolemia, unspecified: Secondary | ICD-10-CM

## 2018-05-17 LAB — LIPID PANEL
Chol/HDL Ratio: 5.6 ratio — ABNORMAL HIGH (ref 0.0–4.4)
Cholesterol, Total: 179 mg/dL (ref 100–199)
HDL: 32 mg/dL — ABNORMAL LOW (ref >39)
LDL Calculated: 105 mg/dL — ABNORMAL HIGH (ref 0–99)
Triglycerides: 211 mg/dL — ABNORMAL HIGH (ref 0–149)
VLDL CHOLESTEROL CAL: 42 mg/dL — AB (ref 5–40)

## 2018-06-28 ENCOUNTER — Encounter: Payer: Self-pay | Admitting: Dietician

## 2018-06-28 ENCOUNTER — Encounter: Payer: Managed Care, Other (non HMO) | Attending: Obstetrics and Gynecology | Admitting: Dietician

## 2018-06-28 VITALS — Ht 66.0 in | Wt 165.4 lb

## 2018-06-28 DIAGNOSIS — Z713 Dietary counseling and surveillance: Secondary | ICD-10-CM | POA: Diagnosis present

## 2018-06-28 DIAGNOSIS — E7800 Pure hypercholesterolemia, unspecified: Secondary | ICD-10-CM | POA: Insufficient documentation

## 2018-06-28 DIAGNOSIS — E785 Hyperlipidemia, unspecified: Secondary | ICD-10-CM

## 2018-06-28 NOTE — Patient Instructions (Signed)
   Keep up your healthy food choices and eating regularly, great job!  Control the amount of fruit eaten to not more than 2 pieces of fruit or 1 1/2 cups at any one time.   Check the website for Mediterranean eating: www.oldwayspt.org for recipes and other nutrition information.   Continue with regular walking to help increase healthy cholesterol, HDL.

## 2018-06-28 NOTE — Progress Notes (Signed)
Medical Nutrition Therapy: Visit start time: 0900  end time: 1000  Assessment:  Diagnosis: hyperlipidemia Past medical history: none significant per patient Psychosocial issues/ stress concerns: none  Preferred learning method:  . Auditory . Visual . Hands-on   Current weight: 165.4lbs Height: 5'6" Medications, supplements: reconciled list in medical record  Progress and evaluation: Patient reports stable weight, close to her goal of 160lbs. She reports triglycerides have been elevated and have not decreased despite taking statin medication; LDL and total cholesterol have improved significantly, LDL now 105 after 215 4 months ago. She reports skipping meals due to time or forgetting to eat, but in the past 2 weeks has begun eating more regularly. She has also made changes to reduce fat and sugar intake, although she loves fruits and reports eating multiple fruit servings at a sitting, several times daily.   Physical activity: walking on the job and on breaks during the day.   Dietary Intake:  Usual eating pattern includes 3 meals and 1-2 snacks per day. Dining out frequency: 2 meals per month.  Breakfast: coffee at home, then [facility]--soy protein shake often with flaxseed and banana or orange (past 2 weeks); weekends raisin bran with small amount soy milk and berries or peaches Snack: none Lunch: 11:30-12 -- peanut butter sandwich often (sometimes with jelly); pita bread/whole wheat tortilla/English muffin with chicken or lowfat string cheese, 2-3 oranges and/or apple, nuts and raisins Snack: cheerios and fruit Supper: past 2-3 weeks chicken (alfredo 9/17), omelet with spinach, tomato, cheese, chicken; salad with homegrown tomatoes; avoiding red meats; salmon with brown rice  Snack: yogurt with flax oil; or peanuts, or fruit orange, apple, grapes Beverages: 7-8 bottles water; rarely ginger ale  Nutrition Care Education: Topics covered: hyperlipidemia Basic nutrition: basic food  groups, appropriate nutrient balance, appropriate meal and snack schedule, general nutrition guidelines    Hyperlipidemia:  target goals for lipids, healthy and unhealthy fats, role of fiber and food sources of fiber and healthy unrefined carbohydrates; importance of limiting sugar and refined starches; Mediterranean diet outline and sample menu; role of exercise.   Nutritional Diagnosis:  [place]-2.2 Altered nutrition-related laboratory As related to hyperlipidemia.  As evidenced by patient with elevated triglycerides and history of elevated LDL taking statin medication.  Intervention: Instruction as noted above.   Commended patient for healthy changes she has made.   Discussed reducing fruit portions to further decrease sugar intake.    Set goals with direction from patient.   No follow-up scheduled at this time; patient will schedule later if needed.   Education Materials given:  . General diet guidelines for Cholesterol-lowering/ Heart health . Mediterranean diet with Plate Planner ([place]) . Sample menus . 12 Great Ways to add Beans...lentils...hummus . Healthy carbohydrates handout . Goals/ instructions   Learner/ who was taught:  . Patient    Level of understanding: Marland Kitchen. Verbalizes/ demonstrates competency   Demonstrated degree of understanding via:   Teach back Learning barriers: . None  Willingness to learn/ readiness for change: . Eager, change in progress   Monitoring and Evaluation:  Dietary intake, exercise, blood lipids      follow up: prn

## 2018-07-31 ENCOUNTER — Ambulatory Visit
Admission: RE | Admit: 2018-07-31 | Discharge: 2018-07-31 | Disposition: A | Discharge status: Home / Self Care | Payer: Managed Care, Other (non HMO) | Source: Ambulatory Visit | Attending: Emergency Medicine | Admitting: Emergency Medicine

## 2018-07-31 ENCOUNTER — Ambulatory Visit: Payer: Self-pay | Admitting: Emergency Medicine

## 2018-07-31 VITALS — BP 116/78 | HR 77 | Temp 98.7°F | Resp 14

## 2018-07-31 DIAGNOSIS — S8992XA Unspecified injury of left lower leg, initial encounter: Secondary | ICD-10-CM | POA: Diagnosis present

## 2018-07-31 DIAGNOSIS — M7989 Other specified soft tissue disorders: Secondary | ICD-10-CM | POA: Diagnosis not present

## 2018-07-31 DIAGNOSIS — X58XXXA Exposure to other specified factors, initial encounter: Secondary | ICD-10-CM | POA: Diagnosis not present

## 2018-07-31 NOTE — Progress Notes (Signed)
  Subjective:     Patient ID: Valerie [name], female   DOB: [birth year], 57 y.o.   MRN: [id number]  HPI Ago, accidentally tripped landing on left knee.  Scraped the left knee but no excessive bleeding.  Now has pain 4 out of 10 intensity that is sharp and dull without radiation.  Pain mostly right over patella.  She cannot weight-bear without severe pain.  Has been using a cane that she barred from her husband.  She tried putting on Ace bandage which helped somewhat. Review of systems otherwise negative.  Denies chance of pregnancy.-Status post hysterectomy. Denies prior left knee problems  Review of Systems  All other systems reviewed and are negative.      Objective:   Physical Exam  Constitutional: She is oriented to person, place, and time. She appears well-developed and well-nourished. No distress.  Alert and cooperative without any cardiorespiratory distress.  She is uncomfortable from left knee pain and swelling, using a cane to ambulate.  She avoids direct pressure on the left lower extremity  HENT:  Head: Normocephalic and atraumatic.  Eyes: Pupils are equal, round, and reactive to light. No scleral icterus.  Neck: Normal range of motion. Neck supple.  Cardiovascular: Normal rate and regular rhythm.  Pulmonary/Chest: Effort normal.  Abdominal: She exhibits no distension.  Musculoskeletal:       Left knee: She exhibits decreased range of motion, swelling and bony tenderness (Especially directly over patella). She exhibits no ecchymosis. Tenderness found. Medial joint line (Minimal) and lateral joint line (Minimal) tenderness noted. No patellar tendon tenderness noted.       Legs: Neurological: She is alert and oriented to person, place, and time. No cranial nerve deficit.  Skin: Skin is warm and dry.  Psychiatric: She has a normal mood and affect. Her behavior is normal.  Vitals reviewed.  She agrees with ordering x-ray:  EXAM: LEFT KNEE - COMPLETE 4+ VIEW  COMPARISON:  None.  FINDINGS: No evidence of fracture, dislocation, or joint effusion. No evidence of arthropathy or other focal bone abnormality. Soft tissue swelling overlying the patella.  IMPRESSION: No acute fracture or dislocation identified. Soft tissue swelling overlying the patella.   Electronically Signed By: Mitzi Hansen M.D. On: 07/31/2018 14:09     Assessment:     Contusion left knee.  No evidence of fracture or dislocation by x-ray.  Clinically no instability by exam.    Plan:     Explained to patient. I applied 4 inch Ace bandage and she felt that helped the pain.  She declined prescription pain med and she prefers to use ibuprofen as needed. I suggested crutches, but she prefers to continue using cane to avoid excessive weightbearing left knee. Other symptomatic care discussed.  Passive range of motion discussed. Follow-up with PCP or orthopedist if no better 1 week or sooner if worse or new symptoms. Red flags discussed. She voiced understanding and agreement with above

## 2018-11-27 ENCOUNTER — Ambulatory Visit: Payer: Self-pay | Admitting: Adult Health

## 2018-11-27 ENCOUNTER — Other Ambulatory Visit: Payer: Self-pay

## 2018-11-27 VITALS — BP 116/60 | HR 82 | Temp 98.2°F | Resp 14

## 2018-11-27 DIAGNOSIS — H6503 Acute serous otitis media, bilateral: Secondary | ICD-10-CM

## 2018-11-27 MED ORDER — AMOXICILLIN-POT CLAVULANATE 875-125 MG PO TABS: 1.0000 | ORAL_TABLET | Freq: Two times a day (BID) | ORAL | 0 refills | Status: DC

## 2018-11-27 MED ORDER — SALINE SPRAY 0.65 % NA SOLN: 2.0000 | NASAL | 0 refills | Status: DC | PRN

## 2018-11-27 MED ORDER — CETIRIZINE HCL 10 MG PO TABS: 10.0000 mg | ORAL_TABLET | Freq: Every day | ORAL | 0 refills | Status: DC

## 2018-11-27 NOTE — Progress Notes (Signed)
[facility]  Subjective:     Patient ID: Valerie [name], female   DOB: 02-16-1961, 58 y.o.   MRN: [id number]  HPI Patient is a 58 year old female in no acute distress who comes to the clinic for nasal congestion . Bilateral ear pain and pressure.   She has had bloody mucous at times from nasal passages. Denies any significant bleeding.  She reports this has happened maybe 2-3 times.  She denies any head trauma or facial injury.  She denies any injury at all. Running her heat at home last few weeks.    Patient  denies any fever, body aches,chills, rash, chest pain, shortness of breath, nausea, vomiting, or diarrhea.   No Known Allergies  Patient Active Problem List   Diagnosis Date Noted  . Uterovaginal prolapse, unspecified 06/10/2014     Current Outpatient Medications:  .  atorvastatin (LIPITOR) 80 MG tablet, Take 80 mg by mouth daily., Disp: , Rfl: 11 .  Cholecalciferol (VITAMIN D3) 2000 units CHEW, Chew by mouth., Disp: , Rfl:  .  Multiple Vitamins-Minerals (ONE-A-DAY WOMENS VITACRAVES) CHEW, Chew by mouth., Disp: , Rfl:    Review of Systems  Constitutional: Negative.   HENT: Positive for congestion, ear pain, nosebleeds and sinus pressure. Negative for dental problem, drooling, ear discharge, facial swelling, hearing loss, mouth sores, postnasal drip, rhinorrhea, sinus pain, sneezing, sore throat, tinnitus, trouble swallowing and voice change.   Eyes: Negative.   Respiratory: Positive for cough. Negative for apnea, choking, chest tightness, shortness of breath, wheezing and stridor.   Cardiovascular: Negative.  Negative for chest pain, palpitations and leg swelling.  Gastrointestinal: Negative.   Endocrine: Negative for polydipsia, polyphagia and polyuria.  Genitourinary: Negative.   Musculoskeletal: Negative.   Skin: Negative.   Neurological: Negative.   Hematological: Negative.   Psychiatric/Behavioral: Negative.         Objective:   Physical Exam Vitals signs reviewed.  Constitutional:      General: She is not in acute distress.    Appearance: She is well-developed. She is not ill-appearing, toxic-appearing or diaphoretic.     Comments: Patient is alert and oriented and responsive to questions Engages in eye contact with provider. Speaks in full sentences without any pauses without any shortness of breath or distress.    HENT:     Head: Normocephalic and atraumatic.     Salivary Glands: Right salivary gland is not diffusely enlarged or tender. Left salivary gland is not diffusely enlarged or tender.     Right Ear: Hearing, ear canal and external ear normal. A middle ear effusion is present. There is no impacted cerumen. Tympanic membrane is erythematous. Tympanic membrane is not perforated.     Left Ear: Hearing, ear canal and external ear normal. A middle ear effusion is present. There is no impacted cerumen. Tympanic membrane is erythematous. Tympanic membrane is not perforated.     Nose: Mucosal edema, congestion and rhinorrhea present.     Right Sinus: No maxillary sinus tenderness or frontal sinus tenderness.     Left Sinus: No maxillary sinus tenderness or frontal sinus tenderness.     Mouth/Throat:     Lips: Pink.     Mouth: Mucous membranes are moist.     Pharynx: Oropharynx is clear. Uvula midline. No oropharyngeal exudate or posterior oropharyngeal erythema.     Tonsils: No tonsillar exudate or tonsillar abscesses. Swelling: 0 on the right. 0 on the left.  Eyes:     General: Lids  are normal. No scleral icterus.       Right eye: No discharge.        Left eye: No discharge.     Conjunctiva/sclera: Conjunctivae normal.     Pupils: Pupils are equal, round, and reactive to light.  Neck:     Musculoskeletal: Full passive range of motion without pain, normal range of motion and neck supple.     Vascular: No JVD.     Trachea: Trachea normal. No tracheal deviation.     Meningeal: Brudzinski's sign  absent.  Cardiovascular:     Rate and Rhythm: Normal rate and regular rhythm.     Heart sounds: Normal heart sounds. No murmur. No friction rub. No gallop.   Pulmonary:     Effort: Pulmonary effort is normal. No respiratory distress.     Breath sounds: Normal breath sounds. No stridor. No wheezing, rhonchi or rales.  Chest:     Chest wall: No tenderness.  Abdominal:     General: Bowel sounds are normal.     Palpations: Abdomen is soft.  Musculoskeletal: Normal range of motion.     Comments: Patient moves on and off of exam table and in room without difficulty. Gait is normal in hall and in room. Patient is oriented to person place time and situation. Patient answers questions appropriately and engages in conversation.   Lymphadenopathy:     Head:     Right side of head: No submental, submandibular, tonsillar, preauricular, posterior auricular or occipital adenopathy.     Left side of head: No submental, submandibular, tonsillar, preauricular, posterior auricular or occipital adenopathy.     Cervical: No cervical adenopathy.     Right cervical: No superficial, deep or posterior cervical adenopathy.    Left cervical: No superficial, deep or posterior cervical adenopathy.  Skin:    General: Skin is warm and dry.     Capillary Refill: Capillary refill takes less than 2 seconds.     Coloration: Skin is not pale.     Findings: No erythema or rash.     Nails: There is no clubbing.   Neurological:     General: No focal deficit present.     Mental Status: She is alert and oriented to person, place, and time.     Cranial Nerves: Cranial nerves are intact.     Sensory: Sensation is intact.     Motor: Motor function is intact.     Coordination: Coordination is intact.     Gait: Gait is intact. Gait normal.  Psychiatric:        Attention and Perception: Attention normal.        Mood and Affect: Mood normal.        Speech: Speech normal.        Behavior: Behavior normal. Behavior is  cooperative.        Thought Content: Thought content normal.        Cognition and Memory: Cognition normal.        Judgment: Judgment normal.        Assessment:    Non-recurrent acute serous otitis media of both ears   Plan:      Meds ordered this encounter  Medications  . amoxicillin-clavulanate (AUGMENTIN) 875-125 MG tablet    Sig: Take 1 tablet by mouth 2 (two) times daily.    Dispense:  20 tablet    Refill:  0  . sodium chloride (OCEAN) 0.65 % SOLN nasal spray    Sig: Place 2 sprays  into both nostrils as needed for congestion.    Dispense:  1 Bottle    Refill:  0  . cetirizine (ZYRTEC) 10 MG tablet    Sig: Take 1 tablet (10 mg total) by mouth daily.    Dispense:  30 tablet    Refill:  0     Advised patient call the office or your primary care doctor for an appointment if no improvement within 72 hours or if any symptoms change or worsen at any time  Advised ER or urgent Care if after hours or on weekend. Call 911 for emergency symptoms at any time.Patinet verbalized understanding of all instructions given/reviewed and treatment plan and has no further questions or concerns at this time.    Patient verbalized understanding of all instructions given and denies any further questions at this time.

## 2018-11-27 NOTE — Patient Instructions (Addendum)
Cetirizine tablets What is this medicine? CETIRIZINE (se TI ra zeen) is an antihistamine. This medicine is used to treat or prevent symptoms of allergies. It is also used to help reduce itchy skin rash and hives. This medicine may be used for other purposes; ask your health care provider or pharmacist if you have questions. COMMON BRAND NAME(S): All Day Allergy, Allergy Relief, Zyrtec, Zyrtec Hives Relief What should I tell my health care provider before I take this medicine? They need to know if you have any of these conditions: -kidney disease -liver disease -an unusual or allergic reaction to cetirizine, hydroxyzine, other medicines, foods, dyes, or preservatives -pregnant or trying to get pregnant -breast-feeding How should I use this medicine? Take this medicine by mouth with a glass of water. Follow the directions on the prescription label. You can take this medicine with food or on an empty stomach. Take your medicine at regular times. Do not take more often than directed. You may need to take this medicine for several days before your symptoms improve. Talk to your pediatrician regarding the use of this medicine in children. Special care may be needed. While this drug may be prescribed for children as young as 546 years of age for selected conditions, precautions do apply. Overdosage: If you think you have taken too much of this medicine contact a poison control center or emergency room at once. NOTE: This medicine is only for you. Do not share this medicine with others. What if I miss a dose? If you miss a dose, take it as soon as you can. If it is almost time for your next dose, take only that dose. Do not take double or extra doses. What may interact with this medicine? -alcohol -certain medicines for anxiety or sleep -narcotic medicines for pain -other medicines for colds or allergies This list may not describe all possible interactions. Give your health care provider a list of  all the medicines, herbs, non-prescription drugs, or dietary supplements you use. Also tell them if you smoke, drink alcohol, or use illegal drugs. Some items may interact with your medicine. What should I watch for while using this medicine? Visit your doctor or health care professional for regular checks on your health. Tell your doctor if your symptoms do not improve. You may get drowsy or dizzy. Do not drive, use machinery, or do anything that needs mental alertness until you know how this medicine affects you. Do not stand or sit up quickly, especially if you are an older patient. This reduces the risk of dizzy or fainting spells. Your mouth may get dry. Chewing sugarless gum or sucking hard candy, and drinking plenty of water may help. Contact your doctor if the problem does not go away or is severe. What side effects may I notice from receiving this medicine? Side effects that you should report to your doctor or health care professional as soon as possible: -allergic reactions like skin rash, itching or hives, swelling of the face, lips, or tongue -changes in vision or hearing -fast or irregular heartbeat -trouble passing urine or change in the amount of urine Side effects that usually do not require medical attention (report to your doctor or health care professional if they continue or are bothersome): -dizziness -dry mouth -irritability -sore throat -stomach pain -tiredness This list may not describe all possible side effects. Call your doctor for medical advice about side effects. You may report side effects to FDA at 1-800-FDA-1088. Where should I keep my medicine? Keep  out of the reach of children. Store at room temperature between 15 and 30 degrees C (59 and 86 degrees F). Throw away any unused medicine after the expiration date. NOTE: This sheet is a summary. It may not cover all possible information. If you have questions about this medicine, talk to your doctor, pharmacist, or  health care provider.  2019 Elsevier/Gold Standard (2014-10-22 13:44:42) Amoxicillin; Clavulanic Acid tablets What is this medicine? AMOXICILLIN; CLAVULANIC ACID (a mox i SIL in; KLAV yoo lan ic AS id) is a penicillin antibiotic. It is used to treat certain kinds of bacterial infections. It will not work for colds, flu, or other viral infections. This medicine may be used for other purposes; ask your health care provider or pharmacist if you have questions. COMMON BRAND NAME(S): Augmentin What should I tell my health care provider before I take this medicine? They need to know if you have any of these conditions: -bowel disease, like colitis -kidney disease -liver disease -mononucleosis -an unusual or allergic reaction to amoxicillin, penicillin, cephalosporin, other antibiotics, clavulanic acid, other medicines, foods, dyes, or preservatives -pregnant or trying to get pregnant -breast-feeding How should I use this medicine? Take this medicine by mouth with a full glass of water. Follow the directions on the prescription label. Take at the start of a meal. Do not crush or chew. If the tablet has a score line, you may cut it in half at the score line for easier swallowing. Take your medicine at regular intervals. Do not take your medicine more often than directed. Take all of your medicine as directed even if you think you are better. Do not skip doses or stop your medicine early. Talk to your pediatrician regarding the use of this medicine in children. Special care may be needed. Overdosage: If you think you have taken too much of this medicine contact a poison control center or emergency room at once. NOTE: This medicine is only for you. Do not share this medicine with others. What if I miss a dose? If you miss a dose, take it as soon as you can. If it is almost time for your next dose, take only that dose. Do not take double or extra doses. What may interact with this  medicine? -allopurinol -anticoagulants -birth control pills -methotrexate -probenecid This list may not describe all possible interactions. Give your health care provider a list of all the medicines, herbs, non-prescription drugs, or dietary supplements you use. Also tell them if you smoke, drink alcohol, or use illegal drugs. Some items may interact with your medicine. What should I watch for while using this medicine? Tell your doctor or health care professional if your symptoms do not improve. Do not treat diarrhea with over the counter products. Contact your doctor if you have diarrhea that lasts more than 2 days or if it is severe and watery. If you have diabetes, you may get a false-positive result for sugar in your urine. Check with your doctor or health care professional. Birth control pills may not work properly while you are taking this medicine. Talk to your doctor about using an extra method of birth control. What side effects may I notice from receiving this medicine? Side effects that you should report to your doctor or health care professional as soon as possible: -allergic reactions like skin rash, itching or hives, swelling of the face, lips, or tongue -breathing problems -dark urine -fever or chills, sore throat -redness, blistering, peeling or loosening of the skin, including inside  the mouth -seizures -trouble passing urine or change in the amount of urine -unusual bleeding, bruising -unusually weak or tired -white patches or sores in the mouth or throat Side effects that usually do not require medical attention (report to your doctor or health care professional if they continue or are bothersome): -diarrhea -dizziness -headache -nausea, vomiting -stomach upset -vaginal or anal irritation This list may not describe all possible side effects. Call your doctor for medical advice about side effects. You may report side effects to FDA at 1-800-FDA-1088. Where should I  keep my medicine? Keep out of the reach of children. Store at room temperature below 25 degrees C (77 degrees F). Keep container tightly closed. Throw away any unused medicine after the expiration date. NOTE: This sheet is a summary. It may not cover all possible information. If you have questions about this medicine, talk to your doctor, pharmacist, or health care provider.  2019 Elsevier/Gold Standard (2007-12-21 12:04:30)   Otitis Media, Adult  Otitis media means that the middle ear is red and swollen (inflamed) and full of fluid. The condition usually goes away on its own. Follow these instructions at home:  Take over-the-counter and prescription medicines only as told by your doctor.  If you were prescribed an antibiotic medicine, take it as told by your doctor. Do not stop taking the antibiotic even if you start to feel better.  Keep all follow-up visits as told by your doctor. This is important. Contact a doctor if:  You have bleeding from your nose.  There is a lump on your neck.  You are not getting better in 5 days.  You feel worse instead of better. Get help right away if:  You have pain that is not helped with medicine.  You have swelling, redness, or pain around your ear.  You get a stiff neck.  You cannot move part of your face (paralyzed).  You notice that the bone behind your ear hurts when you touch it.  You get a very bad headache. Summary  Otitis media means that the middle ear is red, swollen, and full of fluid.  This condition usually goes away on its own. In some cases, treatment may be needed.  If you were prescribed an antibiotic medicine, take it as told by your doctor. This information is not intended to replace advice given to you by your health care provider. Make sure you discuss any questions you have with your health care provider. Document Released: 03/15/2008 Document Revised: 10/18/2016 Document Reviewed: 10/18/2016 Elsevier  Interactive Patient Education  2019 [employer] Nosebleed, Adult A nosebleed is when blood comes out of the nose. Nosebleeds are common. Usually, they are not a sign of a serious condition. Nosebleeds can happen if a small blood vessel in your nose starts to bleed or if the lining of your nose (mucous membrane) cracks. They are commonly caused by:  Allergies.  Colds.  Picking your nose.  Blowing your nose too hard.  An injury from sticking an object into your nose or getting hit in the nose.  Dry or cold air. Less common causes of nosebleeds include:  Toxic fumes.  Something abnormal in the nose or in the air-filled spaces in the bones of the face (sinuses).  Growths in the nose, such as polyps.  Medicines or conditions that cause blood to clot slowly.  Certain illnesses or procedures that irritate or dry out the nasal passages. Follow these instructions at home: When you have a nosebleed:  Sit down and tilt your head slightly forward.  Use a clean towel or tissue to pinch your nostrils under the bony part of your nose. After 10 minutes, let go of your nose and see if bleeding starts again. Do not release pressure before that time. If there is still bleeding, repeat the pinching and holding for 10 minutes until the bleeding stops.  Do not place tissues or gauze in the nose to stop bleeding.  Avoid lying down and avoid tilting your head backward. That may make blood collect in the throat and cause gagging or coughing.  Use a nasal spray decongestant to help with a nosebleed as told by your health care provider.  Do not use petroleum jelly or mineral oil in your nose. It can drip into your lungs. After a nosebleed:  Avoid blowing your nose or sniffing for a number of hours.  Avoid straining, lifting, or bending at the waist for several days. You may resume other normal activities as you are able.  Use saline spray or a humidifier as told by your health care  provider.  Aspirinand blood thinners make bleeding more likely. If you are prescribed these medicines and you suffer from nosebleeds: ? Ask your health care provider if you should stop taking the medicines or if you should adjust the dose. ? Do not stop taking medicines that your health care provider has recommended unless told by your health care provider.  If your nosebleed was caused by dry mucous membranes, use over-the-counter saline nasal spray or gel. This will keep the mucous membranes moist and allow them to heal. If you must use a lubricant: ? Choose one that is water-soluble. ? Use only as much as you need and use it only as often as needed. ? Do not lie down until several hours after you use it. Contact a health care provider if:  You have a fever.  You get nosebleeds often or more often than usual.  You bruise very easily.  You have a nosebleed from having something stuck in your nose.  You have bleeding in your mouth.  You vomit or cough up brown material.  You have a nosebleed after you start a new medicine. Get help right away if:  You have a nosebleed after a fall or a head injury.  Your nosebleed does not go away after 20 minutes.  You feel dizzy or weak.  You have unusual bleeding from other parts of your body.  You have unusual bruising on other parts of your body.  You become sweaty.  You vomit blood. This information is not intended to replace advice given to you by your health care provider. Make sure you discuss any questions you have with your health care provider. Document Released: 07/07/2005 Document Revised: 02/01/2017 Document Reviewed: 04/13/2016 Elsevier Interactive Patient Education  2019 [employer].

## 2019-01-05 ENCOUNTER — Other Ambulatory Visit: Payer: Managed Care, Other (non HMO)

## 2019-01-05 ENCOUNTER — Other Ambulatory Visit: Payer: Self-pay

## 2019-01-05 DIAGNOSIS — E7800 Pure hypercholesterolemia, unspecified: Secondary | ICD-10-CM

## 2019-01-06 LAB — LIPID PANEL WITH LDL/HDL RATIO
CHOLESTEROL TOTAL: 154 mg/dL (ref 100–199)
HDL: 35 mg/dL — ABNORMAL LOW (ref >39)
LDL CALC: 93 mg/dL (ref 0–99)
LDl/HDL Ratio: 2.7 ratio (ref 0.0–3.2)
TRIGLYCERIDES: 131 mg/dL (ref 0–149)
VLDL Cholesterol Cal: 26 mg/dL (ref 5–40)

## 2019-01-06 LAB — GLUCOSE, RANDOM: Glucose: 104 mg/dL — ABNORMAL HIGH (ref 65–99)

## 2019-02-01 NOTE — Addendum Note (Signed)
Addended by: Karen Kays on: 02/01/2019 11:45 AM   Modules accepted: Level of Service

## 2019-02-05 ENCOUNTER — Ambulatory Visit: Payer: Managed Care, Other (non HMO) | Admitting: Adult Health

## 2019-02-05 ENCOUNTER — Other Ambulatory Visit: Payer: Self-pay

## 2019-02-05 DIAGNOSIS — T148XXA Other injury of unspecified body region, initial encounter: Secondary | ICD-10-CM

## 2019-02-05 MED ORDER — CYCLOBENZAPRINE HCL 10 MG PO TABS: 10.0000 mg | ORAL_TABLET | Freq: Three times a day (TID) | ORAL | 0 refills | Status: DC | PRN

## 2019-02-05 MED ORDER — IBUPROFEN 600 MG PO TABS: 600.0000 mg | ORAL_TABLET | Freq: Three times a day (TID) | ORAL | 0 refills | Status: DC | PRN

## 2019-02-05 NOTE — Patient Instructions (Signed)
Muscle Strain A muscle strain is an injury that happens when a muscle is stretched longer than normal. This can happen during a fall, sports, or lifting. This can tear some muscle fibers. Usually, recovery from muscle strain takes 1-2 weeks. Complete healing normally takes 5-6 weeks. This condition is first treated with PRICE therapy. This involves:  Protecting your muscle from being injured again.  Resting your injured muscle.  Icing your injured muscle.  Applying pressure (compression) to your injured muscle. This may be done with a splint or elastic bandage.  Raising (elevating) your injured muscle. Your doctor may also recommend medicine for pain. Follow these instructions at home: If you have a splint:  Wear the splint as told by your doctor. Take it off only as told by your doctor.  Loosen the splint if your fingers or toes tingle, get numb, or turn cold and blue.  Keep the splint clean.  If the splint is not waterproof: ? Do not let it get wet. ? Cover it with a watertight covering when you take a bath or a shower. Managing pain, stiffness, and swelling   If directed, put ice on your injured area. ? If you have a removable splint, take it off as told by your doctor. ? Put ice in a plastic bag. ? Place a towel between your skin and the bag. ? Leave the ice on for 20 minutes, 2-3 times a day.  Move your fingers or toes often. This helps to avoid stiffness and lessen swelling.  Raise your injured area above the level of your heart while you are sitting or lying down.  Wear an elastic bandage as told by your doctor. Make sure it is not too tight. General instructions  Take over-the-counter and prescription medicines only as told by your doctor.  Limit your activity. Rest your injured muscle as told by your doctor. Your doctor may say that gentle movements are okay.  If physical therapy was prescribed, do exercises as told by your doctor.  Do not put pressure on any  part of the splint until it is fully hardened. This may take many hours.  Do not use any products that contain nicotine or tobacco, such as cigarettes and e-cigarettes. These can delay bone healing. If you need help quitting, ask your doctor.  Warm up before you exercise. This helps to prevent more muscle strains.  Ask your doctor when it is safe to drive if you have a splint.  Keep all follow-up visits as told by your doctor. This is important. Contact a doctor if:  You have more pain or swelling in your injured area. Get help right away if:  You have any of these problems in your injured area: ? You have numbness. ? You have tingling. ? You lose a lot of strength. Summary  A muscle strain is an injury that happens when a muscle is stretched longer than normal.  This condition is first treated with PRICE therapy. This includes protecting, resting, icing, adding pressure, and raising your injury.  Limit your activity. Rest your injured muscle as told by your doctor. Your doctor may say that gentle movements are okay.  Warm up before you exercise. This helps to prevent more muscle strains. This information is not intended to replace advice given to you by your health care provider. Make sure you discuss any questions you have with your health care provider. Document Released: 07/06/2008 Document Revised: 11/03/2016 Document Reviewed: 11/03/2016 Elsevier Interactive Patient Education  2019 Else[employer]   Inc[employer]  Cyclobenzaprine tablets What is this medicine? CYCLOBENZAPRINE (sye kloe BEN za preen) is a muscle relaxer. It is used to treat muscle pain, spasms, and stiffness. This medicine may be used for other purposes; ask your health care provider or pharmacist if you have questions. COMMON BRAND NAME(S): Fexmid, Flexeril What should I tell my health care provider before I take this medicine? They need to know if you have any of these conditions: -heart disease, irregular heartbeat, or  previous heart attack -liver disease -thyroid problem -an unusual or allergic reaction to cyclobenzaprine, tricyclic antidepressants, lactose, other medicines, foods, dyes, or preservatives -pregnant or trying to get pregnant -breast-feeding How should I use this medicine? Take this medicine by mouth with a glass of water. Follow the directions on the prescription label. If this medicine upsets your stomach, take it with food or milk. Take your medicine at regular intervals. Do not take it more often than directed. Talk to your pediatrician regarding the use of this medicine in children. Special care may be needed. Overdosage: If you think you have taken too much of this medicine contact a poison control center or emergency room at once. NOTE: This medicine is only for you. Do not share this medicine with others. What if I miss a dose? If you miss a dose, take it as soon as you can. If it is almost time for your next dose, take only that dose. Do not take double or extra doses. What may interact with this medicine? Do not take this medicine with any of the following medications: -MAOIs like Carbex, Eldepryl, Marplan, Nardil, and Parnate This medicine may also interact with the following medications: -alcohol -antihistamines for allergy, cough, and cold -certain medicines for anxiety or sleep -certain medicines for depression like amitriptyline, fluoxetine, sertraline -certain medicines for seizures like phenobarbital, primidone -contrast dyes -local anesthetics like lidocaine, pramoxine, tetracaine -medicines that relax muscles for surgery -narcotic medicines for pain -phenothiazines like chlorpromazine, mesoridazine, prochlorperazine This list may not describe all possible interactions. Give your health care provider a list of all the medicines, herbs, non-prescription drugs, or dietary supplements you use. Also tell them if you smoke, drink alcohol, or use illegal drugs. Some items may  interact with your medicine. What should I watch for while using this medicine? Tell your doctor or health care professional if your symptoms do not start to get better or if they get worse. You may get drowsy or dizzy. Do not drive, use machinery, or do anything that needs mental alertness until you know how this medicine affects you. Do not stand or sit up quickly, especially if you are an older patient. This reduces the risk of dizzy or fainting spells. Alcohol may interfere with the effect of this medicine. Avoid alcoholic drinks. If you are taking another medicine that also causes drowsiness, you may have more side effects. Give your health care provider a list of all medicines you use. Your doctor will tell you how much medicine to take. Do not take more medicine than directed. Call emergency for help if you have problems breathing or unusual sleepiness. Your mouth may get dry. Chewing sugarless gum or sucking hard candy, and drinking plenty of water may help. Contact your doctor if the problem does not go away or is severe. What side effects may I notice from receiving this medicine? Side effects that you should report to your doctor or health care professional as soon as possible: -allergic reactions like skin rash, itching or hives, swelling  of the face, lips, or tongue -breathing problems -chest pain -fast, irregular heartbeat -hallucinations -seizures -unusually weak or tired Side effects that usually do not require medical attention (report to your doctor or health care professional if they continue or are bothersome): -headache -nausea, vomiting This list may not describe all possible side effects. Call your doctor for medical advice about side effects. You may report side effects to FDA at 1-800-FDA-1088. Where should I keep my medicine? Keep out of the reach of children. Store at room temperature between 15 and 30 degrees C (59 and 86 degrees F). Keep container tightly closed.  Throw away any unused medicine after the expiration date. NOTE: This sheet is a summary. It may not cover all possible information. If you have questions about this medicine, talk to your doctor, pharmacist, or health care provider.  2019 Elsevier/Gold Standard (2017-07-20 13:04:35) Ibuprofen tablets and capsules What is this medicine? IBUPROFEN (eye BYOO proe fen) is a non-steroidal anti-inflammatory drug (NSAID). It is used for dental pain, fever, headaches or migraines, osteoarthritis, rheumatoid arthritis, or painful monthly periods. It can also relieve minor aches and pains caused by a cold, flu, or sore throat. This medicine may be used for other purposes; ask your health care provider or pharmacist if you have questions. COMMON BRAND NAME(S): Advil, Advil Junior Strength, Advil Migraine, Genpril, Ibren, IBU, Midol, Midol Cramps and Body Aches, Motrin, Motrin IB, Motrin Junior Strength, Motrin Migraine Pain, Samson-8, Toxicology Saliva Collection What should I tell my health care provider before I take this medicine? They need to know if you have any of these conditions: -cigarette smoker -coronary artery bypass graft (CABG) surgery within the past 2 weeks -drink more than 3 alcohol-containing drinks a day -heart disease -high blood pressure -history of stomach bleeding -kidney disease -liver disease -lung or breathing disease, like asthma -an unusual or allergic reaction to ibuprofen, aspirin, other NSAIDs, other medicines, foods, dyes, or preservatives -pregnant or trying to get pregnant -breast-feeding How should I use this medicine? Take this medicine by mouth with a glass of water. Follow the directions on the prescription label. Take this medicine with food if your stomach gets upset. Try to not lie down for at least 10 minutes after you take the medicine. Take your medicine at regular intervals. Do not take your medicine more often than directed. A special MedGuide will be given  to you by the pharmacist with each prescription and refill. Be sure to read this information carefully each time. Talk to your pediatrician regarding the use of this medicine in children. Special care may be needed. Overdosage: If you think you have taken too much of this medicine contact a poison control center or emergency room at once. NOTE: This medicine is only for you. Do not share this medicine with others. What if I miss a dose? If you miss a dose, take it as soon as you can. If it is almost time for your next dose, take only that dose. Do not take double or extra doses. What may interact with this medicine? Do not take this medicine with any of the following medications: -cidofovir -ketorolac -methotrexate -pemetrexed This medicine may also interact with the following medications: -alcohol -aspirin -diuretics -lithium -other drugs for inflammation like prednisone -warfarin This list may not describe all possible interactions. Give your health care provider a list of all the medicines, herbs, non-prescription drugs, or dietary supplements you use. Also tell them if you smoke, drink alcohol, or use illegal drugs. Some items  may interact with your medicine. What should I watch for while using this medicine? Tell your doctor or healthcare professional if your symptoms do not start to get better or if they get worse. This medicine does not prevent heart attack or stroke. In fact, this medicine may increase the chance of a heart attack or stroke. The chance may increase with longer use of this medicine and in people who have heart disease. If you take aspirin to prevent heart attack or stroke, talk with your doctor or health care professional. Do not take other medicines that contain aspirin, ibuprofen, or naproxen with this medicine. Side effects such as stomach upset, nausea, or ulcers may be more likely to occur. Many medicines available without a prescription should not be taken with  this medicine. This medicine can cause ulcers and bleeding in the stomach and intestines at any time during treatment. Ulcers and bleeding can happen without warning symptoms and can cause death. To reduce your risk, do not smoke cigarettes or drink alcohol while you are taking this medicine. You may get drowsy or dizzy. Do not drive, use machinery, or do anything that needs mental alertness until you know how this medicine affects you. Do not stand or sit up quickly, especially if you are an older patient. This reduces the risk of dizzy or fainting spells. This medicine can cause you to bleed more easily. Try to avoid damage to your teeth and gums when you brush or floss your teeth. This medicine may be used to treat migraines. If you take migraine medicines for 10 or more days a month, your migraines may get worse. Keep a diary of headache days and medicine use. Contact your healthcare professional if your migraine attacks occur more frequently. What side effects may I notice from receiving this medicine? Side effects that you should report to your doctor or health care professional as soon as possible: -allergic reactions like skin rash, itching or hives, swelling of the face, lips, or tongue -severe stomach pain -signs and symptoms of bleeding such as bloody or black, tarry stools; red or dark-brown urine; spitting up blood or brown material that looks like coffee grounds; red spots on the skin; unusual bruising or bleeding from the eye, gums, or nose -signs and symptoms of a blood clot such as changes in vision; chest pain; severe, sudden headache; trouble speaking; sudden numbness or weakness of the face, arm, or leg -unexplained weight gain or swelling -unusually weak or tired -yellowing of eyes or skin Side effects that usually do not require medical attention (report to your doctor or health care professional if they continue or are bothersome): -bruising -diarrhea -dizziness, drowsiness  -headache -nausea, vomiting This list may not describe all possible side effects. Call your doctor for medical advice about side effects. You may report side effects to FDA at 1-800-FDA-1088. Where should I keep my medicine? Keep out of the reach of children. Store at room temperature between 15 and 30 degrees C (59 and 86 degrees F). Keep container tightly closed. Throw away any unused medicine after the expiration date. NOTE: This sheet is a summary. It may not cover all possible information. If you have questions about this medicine, talk to your doctor, pharmacist, or health care provider.  2019 Elsevier/Gold Standard (2017-06-01 12:43:57)

## 2019-02-05 NOTE — Progress Notes (Signed)
Virtual Visit via Telephone Note  I connected with Valerie SavageMaria A Crawford [name] on 02/05/19 at  9:00 AM EDT by telephone and verified that I am speaking with the correct person using two identifiers.   I discussed the limitations, risks, security and privacy concerns of performing an evaluation and management service by telephone and the availability of in person appointments. I also discussed with the patient that there may be a patient responsible charge related to this service. The patient expressed understanding and agreed to proceed.   History of Present Illness: Patient is a 58 year old female who calls the clinic she reports she was picking up heavy buckets of paint from one building to another on Saturday 02/02/19 and she reports she was sore afterwards and has become more soreness " between my upper back and middle of my  neck and does hurt with movement - feels like a pulling " . She was at home moving the paint.  She denies any other injury or trauma. She denies any previous spinal surgeries.  She denies any previous pain in these areas. Denies any swallowing difficulties or pain with swallowing.  She reports she at first thought that she had slept wrong Saturday night.   She has tried a heating pad. She took one ibuprofen yesterday but none today.  She had mild improvement in pain with heat and ibuprofen.   She reports 5/10 pain on pain scale.   She denies any paresthesias or radiculopathies. He denies any groin or saddle paresthesias.  She denies any loss of bowel or bowel bladder control. Patient  denies any fever, body aches,chills, rash, chest pain, shortness of breath, nausea, vomiting, or diarrhea.  She denies any jaw pain.  Medications and history reviewed as well as allergies.   No Known Allergies   Observations/Objective: She has no way to take her vital signs.  Patient is alert and oriented and responsive to questions Engages in normal conversation with provider. Speaks in  full sentences without any pauses without any shortness of breath or distress.   She reports she is able to bend her chin to chest. She is able to walk without difficulty she reports.   She was unable to access video visit and requested phone visit only.   Assessment and Plan:  Valerie HesselbachMaria was seen today for virtual visit- neck, shoulder, upper back pain.  Diagnoses and all orders for this visit:  Muscle strain  Other orders -     cyclobenzaprine (FLEXERIL) 10 MG tablet; Take 1 tablet (10 mg total) by mouth 3 (three) times daily as needed for muscle spasms (will cause drowsiness). -     ibuprofen (ADVIL) 600 MG tablet; Take 1 tablet (600 mg total) by mouth every 8 (eight) hours as needed.     Follow Up Instructions: Patient understands limitations of a telephone visit and that is not the same as a physical in the office, she will seek medical care if any symptoms change or worsen at any time.  Patient verbalizes understanding of this and discussed red flags with the patient and when to seek emergency medical care. Provider also reviewed all medications as prescribed, patient denied any kidney disease or gastrointestinal bleed history. She is aware her After Visit summary is in [place].  reviewed After Visit Summary(AVS) with patient. Patient is advised to read the after visit summary as well and let the provider know if any question, concerns or clarifications are needed.  Advised patient call the office or your primary  care doctor for an appointment if no improvement within 72 hours or if any symptoms change or worsen at any time  Advised ER or urgent Care if after hours or on weekend. Call 911 for emergency symptoms at any time.Patinet verbalized understanding of all instructions given/reviewed and treatment plan and has no further questions or concerns at this time.    I discussed the assessment and treatment plan with the patient. The patient was provided an opportunity to ask questions and  all were answered. The patient agreed with the plan and demonstrated an understanding of the instructions.   The patient was advised to call back or seek an in-person evaluation if the symptoms worsen or if the condition fails to improve as anticipated.  I provided 25 minutes of non-face-to-face time during this encounter.   Jairo Ben, FNP

## 2019-05-01 ENCOUNTER — Ambulatory Visit: Payer: Managed Care, Other (non HMO) | Admitting: Adult Health

## 2019-05-01 ENCOUNTER — Encounter: Payer: Self-pay | Admitting: Adult Health

## 2019-05-01 ENCOUNTER — Other Ambulatory Visit: Payer: Self-pay

## 2019-05-01 DIAGNOSIS — B370 Candidal stomatitis: Secondary | ICD-10-CM | POA: Diagnosis not present

## 2019-05-01 MED ORDER — NYSTATIN 100000 UNIT/ML MT SUSP: 5.0000 mL | Freq: Four times a day (QID) | OROMUCOSAL | 0 refills | Status: DC

## 2019-05-01 NOTE — Patient Instructions (Signed)
Nystatin oral suspension What is this medicine? NYSTATIN (nye STAT in) is an antifungal medicine. It is used to treat certain kinds of fungal or yeast infections. This medicine may be used for other purposes; ask your health care provider or pharmacist if you have questions. COMMON BRAND NAME(S): Mycostatin, Nystex What should I tell my health care provider before I take this medicine? They need to know if you have any of these conditions:  diabetes  kidney disease  an unusual or allergic reaction to nystatin, ethylenediamine, parabens, thimerosal, other foods, dyes or preservatives  pregnant or trying to get pregnant  breast-feeding How should I use this medicine? Follow the directions on the prescription label. Shake well before using. Use a specially marked dropper to measure every dose. Ask your pharmacist if you do not have one. Put one half of the dose in each side of your mouth. Swish the medicine around in your mouth and gargle. Hold your dose in your mouth for as long as you can. Swallow or spit out as directed by your doctor. Take your medicine at regular intervals. Do not take your medicine more often than directed. Do not skip doses or stop your medicine early even if you feel better. Do not stop taking except on your doctor's advice. Talk to your pediatrician regarding the use of this medicine in children. Special care may be needed. Overdosage: If you think you have taken too much of this medicine contact a poison control center or emergency room at once. NOTE: This medicine is only for you. Do not share this medicine with others. What if I miss a dose? If you miss a dose, take it as soon as you can. If it is almost time for your next dose, take only that dose. Do not take double or extra doses. What may interact with this medicine? Interactions are not expected. This list may not describe all possible interactions. Give your health care provider a list of all the medicines,  herbs, non-prescription drugs, or dietary supplements you use. Also tell them if you smoke, drink alcohol, or use illegal drugs. Some items may interact with your medicine. What should I watch for while using this medicine? Tell your doctor or health care professional if your symptoms do not improve or get worse. If you wear dentures talk to your doctor about how to clean them. What side effects may I notice from receiving this medicine? Side effects that you should report to your doctor or health care professional as soon as possible:  allergic reactions like skin rash, itching or hives, swelling of the face, lips, or tongue  fast heart beat  redness, blistering, peeling or loosening of the skin, including inside the mouth  trouble breathing Side effects that usually do not require medical attention (report to your doctor or health care professional if they continue or are bothersome):  diarrhea  muscle aches or pains  nausea, vomiting  stomach upset This list may not describe all possible side effects. Call your doctor for medical advice about side effects. You may report side effects to FDA at 1-800-FDA-1088. Where should I keep my medicine? Keep out of the reach of children. Store at room temperature between 15 and 25 degrees C (59 and 77 degrees F). Protect from light. Throw away any unused medicine after the expiration date. NOTE: This sheet is a summary. It may not cover all possible information. If you have questions about this medicine, talk to your doctor, pharmacist, or health care  provider.  2020 Elsevier/Gold Standard (2008-10-29 13:21:00) Oral Thrush, Adult  Oral thrush is an infection in your mouth and throat. It causes white patches on your tongue and in your mouth. Follow these instructions at home: Helping with soreness   To lessen your pain: ? Drink cold liquids, like water and iced tea. ? Eat frozen ice pops or frozen juices. ? Eat foods that are easy to  swallow, like gelatin and ice cream. ? Drink from a straw if the patches in your mouth are painful. General instructions  Take or use over-the-counter and prescription medicines only as told by your doctor. Medicine for oral thrush may be something to swallow, or it may be something to put on the infected area.  Eat plain yogurt that has live cultures in it. Read the label to make sure.  If you wear dentures: ? Take out your dentures before you go to bed. ? Brush them well. ? Soak them in a denture cleaner.  Rinse your mouth with warm salt-water many times a day. To make the salt-water mixture, completely dissolve 1/2-1 teaspoon of salt in 1 cup of warm water. Contact a doctor if:  Your problems are getting worse.  Your problems do not get better in less than 7 days with treatment.  Your infection is spreading. This may show as white patches on the skin outside of your mouth.  You are nursing your baby and you have redness and pain in the nipples. This information is not intended to replace advice given to you by your health care provider. Make sure you discuss any questions you have with your health care provider. Document Released: 12/22/2009 Document Revised: 12/30/2017 Document Reviewed: 06/21/2016 Elsevier Patient Education  2020 [employer].

## 2019-05-01 NOTE — Progress Notes (Signed)
Virtual Visit via Telephone Note  I connected with Valerie [name] on 05/01/19 at 10:00 AM EDT by telephone and verified that I am speaking with the correct person using two identifiers.  Location: Patient: at home  Provider: [facility], [place], [place]     I discussed the limitations, risks, security and privacy concerns of performing an evaluation and management service by telephone and the availability of in person appointments. I also discussed with the patient that there may be a patient responsible charge related to this service. The patient expressed understanding and agreed to proceed.   History of Present Illness: Patient is a 58 year old female in no acute distress who calls the clinic for a telephone visit for " a white tongue ". She reports she tried to brush area off as well. Onset 04/28/19.  History of eating oranges and peaches and guacamole.  Flagyl on 04/16/2019 x 7 days.  No other antibiotics.  No pain.  She has increased water.   Afebrile. No rash.  Patient  denies any fever, body aches,chills, rash, chest pain, shortness of breath, nausea, vomiting, or diarrhea.   No difficulty swallowing.   Observations/Objective:  Patient is alert and oriented and responsive to questions Engages in conversation with provider. Speaks in full sentences without any pauses without any shortness of breath or distress.    Assessment and Plan:  1. Oral candidiasis    Recent use of Flagyl and increased citrus foods.   Meds ordered this encounter  Medications  . nystatin (MYCOSTATIN) 100000 UNIT/ML suspension    Sig: Take 5 mLs (500,000 Units total) by mouth 4 (four) times daily. Swish and hold in mouth as long as able and spit    Dispense:  60 mL    Refill:  0  Discussed above medications and how to use. Call back to office if not improving.   Follow Up Instructions  I discussed the assessment and treatment plan with the patient.  The patient was provided an opportunity to ask questions and all were answered. The patient agreed with the plan and demonstrated an understanding of the instructions.   The patient was advised to call back or seek an in-person evaluation if the symptoms worsen or if the condition fails to improve as anticipated. Advised patient call the office or your primary care doctor for an appointment if no improvement within 72 hours or if any symptoms change or worsen at any time  Advised ER or urgent Care if after hours or on weekend. Call 911 for emergency symptoms at any time.Patinet verbalized understanding of all instructions given/reviewed and treatment plan and has no further questions or concerns at this time.     I provided 15  minutes of non-face-to-face time during this encounter.   Marcille Buffy, FNP

## 2019-05-09 NOTE — H&P (Signed)
Ms. Valerie [name] is a 58 y.o. female here for Feels prolapse is worse .scheduled for a A+P repair s/p tvh in past   Pt using pessary  Ring with sleeve #5 . Pt c/o increasing pressure . And discomfort .+ abnormal d/c vaginal  not sexually active  No splinting with BM  Past Medical History:  has a past medical history of Cervical dysplasia, Descensus uteri, and Fracture of sternum.  Past Surgical History:  has a past surgical history that includes Tubal ligation (Bilateral); Colposcopy with biopsies  (08/22/08); Hysterectomy (12/20/14); TVH with bilateral salpingectomy (2016); and Colonoscopy (06/16/2018). Family History: family history includes Diabetes in her brother and father; Heart disease in her brother; Stroke in her brother. Social History:  reports that she has never smoked. She has never used smokeless tobacco. She reports that she does not drink alcohol or use drugs. OB/GYN History:          OB History    Gravida  2   Para  2   Term  2   Preterm      AB      Living  2     SAB      TAB      Ectopic      Molar      Multiple      Live Births             Allergies: has No Known Allergies. Medications:  Current Outpatient Medications:  .  atorvastatin (LIPITOR) 40 MG tablet, Take 2 tablets (80 mg total) by mouth once daily, Disp: 60 tablet, Rfl: 11 .  cholecalciferol (VITAMIN D3) 400 unit chewable tablet, Take 400 Units by mouth once daily., Disp: , Rfl:  .  multivit with min-folic acid (ONE-A-DAY WOMEN VITACRAVES) 200 mcg Chew, Take by mouth., Disp: , Rfl:   Review of Systems: General:                      No fatigue or weight loss Eyes:                           No vision changes Ears:                            No hearing difficulty Respiratory:                No cough or shortness of breath Pulmonary:                  No asthma or shortness of breath Cardiovascular:           No chest pain, palpitations, dyspnea on  exertion Gastrointestinal:          No abdominal bloating, chronic diarrhea, constipations, masses, pain or hematochezia Genitourinary:             No hematuria, dysuria, abnormal vaginal discharge, pelvic pain, Menometrorrhagia, + pelvic pressure  Lymphatic:                   No swollen lymph nodes Musculoskeletal:         No muscle weakness Neurologic:                  No extremity weakness, syncope, seizure disorder Psychiatric:                  No history of depression, delusions  or suicidal/homicidal ideation    Exam:      Vitals:   007/30/2020 /  BP: 109/69  Pulse: 89    Body mass index is 26.47 kg/m.  WDWN white/  female in NAD   Lungs: CTA  CV : RRR without murmur   Neck:  no thyromegaly Abdomen: soft , no mass, normal active bowel sounds,  non-tender, no rebound tenderness Pelvic: tanner stage 5 ,  External genitalia: vulva /labia no lesions Urethra: no prolapse Vagina: normal physiologic d/c, 2-3 cystocele with valsalva , 1-2 rectocele with valsalva wet mount : + clue cells  Cervix: absent Uterus:absent Adnexa: no mass,  non-tender   Pessary removed and cleaned  And replace   Impression:   The primary encounter diagnosis was Cystocele, midline. Diagnoses of Rectocele, female,   Plan:  Spoke to her about continued pessary use  Vs surgical intervention . Pros and cons discussed . Diagrams of repaired showed  Risks of urinary retention requiring catheterization and risk of causing SUI discussed .  She is interested in having anterior and posterior colporrhaphy performed   risks of the procedure have been discussed including urinary retention and urinary incontinence      Valerie Elenora GammaJANSE SCHERMERHORN, MD

## 2019-05-15 ENCOUNTER — Other Ambulatory Visit: Payer: Self-pay

## 2019-05-15 ENCOUNTER — Encounter
Admission: RE | Admit: 2019-05-15 | Discharge: 2019-05-15 | Disposition: A | Discharge status: Home / Self Care | Payer: Managed Care, Other (non HMO) | Source: Ambulatory Visit | Attending: Obstetrics and Gynecology | Admitting: Obstetrics and Gynecology

## 2019-05-15 NOTE — Patient Instructions (Addendum)
Your procedure is scheduled on:  Report to [place]. To find out your arrival time please call [phone] between 1PM - 3PM on 05/18/19.  Remember: Instructions that are not followed completely may result in serious medical risk, up to and including death, or upon the discretion of your surgeon and anesthesiologist your surgery may need to be rescheduled.     _X__ 1. Do not eat food after midnight the night before your procedure.                 No gum chewing or hard candies. You may drink clear liquids up to 2 hours                 before you are scheduled to arrive for your surgery- DO not drink clear                 liquids within 2 hours of the start of your surgery.                 Clear Liquids include:  water, apple juice without pulp, clear carbohydrate                 drink such as Clearfast or Gatorade, Black Coffee or Tea (Do not add                 anything to coffee or tea). Diabetics water only  __X__2.  On the morning of surgery brush your teeth with toothpaste and water, you                 may rinse your mouth with mouthwash if you wish.  Do not swallow any              toothpaste of mouthwash.     _X__ 3.  No Alcohol for 24 hours before or after surgery.   _X__ 4.  Do Not Smoke or use e-cigarettes For 24 Hours Prior to Your Surgery.                 Do not use any chewable tobacco products for at least 6 hours prior to                 surgery.  ____  5.  Bring all medications with you on the day of surgery if instructed.   __X__  6.  Notify your doctor if there is any change in your medical condition      (cold, fever, infections).     Do not wear jewelry, make-up, hairpins, clips or nail polish. Do not wear lotions, powders, or perfumes.  Do not shave 48 hours prior to surgery. Men may shave face and neck. Do not bring valuables to the hospital.    [facility] is not responsible for any belongings or  valuables.  Contacts, dentures/partials or body piercings may not be worn into surgery. Bring a case for your contacts, glasses or hearing aids, a denture cup will be supplied. Leave your suitcase in the car. After surgery it may be brought to your room. For patients admitted to the hospital, discharge time is determined by your treatment team.   Patients discharged the day of surgery will not be allowed to drive home.   Please read over the following fact sheets that you were given:   MRSA Information  __X__ Take these medicines the morning of surgery with A SIP OF WATER:  1.   2.   3.   4.  5.  6.  ____ Fleet Enema (as directed)   __X__ Use CHG Soap/SAGE wipes as directed  ____ Use inhalers on the day of surgery  ____ Stop metformin/Janumet/Farxiga 2 days prior to surgery    ____ Take 1/2 of usual insulin dose the night before surgery. No insulin the morning          of surgery.   ____ Stop Blood Thinners Coumadin/Plavix/Xarelto/Pleta/Pradaxa/Eliquis/Effient/Aspirin  on   Or contact your Surgeon, Cardiologist or Medical Doctor regarding  ability to stop your blood thinners  __X__ Stop Anti-inflammatories 7 days before surgery such as Advil, Ibuprofen, Motrin,  BC or Goodies Powder, Naprosyn, Naproxen, Aleve, Aspirin    __X__ Stop all herbal supplements, fish oil or vitamin E until after surgery.    ____ Bring C-Pap to the hospital.      TELEPHONE INTERVIEW WITH PATIENT. REVIEWED ALL INSTRUCTIONS.PATIENT VERBALIZED UNDERSTANDING.

## 2019-05-17 ENCOUNTER — Other Ambulatory Visit: Payer: Self-pay

## 2019-05-17 ENCOUNTER — Other Ambulatory Visit
Admission: RE | Admit: 2019-05-17 | Discharge: 2019-05-17 | Disposition: A | Discharge status: Home / Self Care | Payer: Managed Care, Other (non HMO) | Source: Ambulatory Visit | Attending: Obstetrics and Gynecology | Admitting: Obstetrics and Gynecology

## 2019-05-17 DIAGNOSIS — Z01812 Encounter for preprocedural laboratory examination: Secondary | ICD-10-CM | POA: Insufficient documentation

## 2019-05-17 DIAGNOSIS — Z20828 Contact with and (suspected) exposure to other viral communicable diseases: Secondary | ICD-10-CM | POA: Diagnosis not present

## 2019-05-17 DIAGNOSIS — N8189 Other female genital prolapse: Secondary | ICD-10-CM | POA: Insufficient documentation

## 2019-05-17 LAB — BASIC METABOLIC PANEL
Anion gap: 10 (ref 5–15)
BUN: 13 mg/dL (ref 6–20)
CO2: 23 mmol/L (ref 22–32)
Calcium: 9.7 mg/dL (ref 8.9–10.3)
Chloride: 106 mmol/L (ref 98–111)
Creatinine, Ser: 0.61 mg/dL (ref 0.44–1.00)
GFR calc Af Amer: >60 mL/min (ref >60)
GFR calc non Af Amer: >60 mL/min (ref >60)
Glucose, Bld: 108 mg/dL — ABNORMAL HIGH (ref 70–99)
Potassium: 4 mmol/L (ref 3.5–5.1)
Sodium: 139 mmol/L (ref 135–145)

## 2019-05-17 LAB — TYPE AND SCREEN
ABO/RH(D): A POS
Antibody Screen: NEGATIVE

## 2019-05-17 LAB — CBC
HCT: 38.9 % (ref 36.0–46.0)
Hemoglobin: 12.8 g/dL (ref 12.0–15.0)
MCH: 28.8 pg (ref 26.0–34.0)
MCHC: 32.9 g/dL (ref 30.0–36.0)
MCV: 87.4 fL (ref 80.0–100.0)
Platelets: 217 10*3/uL (ref 150–400)
RBC: 4.45 MIL/uL (ref 3.87–5.11)
RDW: 12.9 % (ref 11.5–15.5)
WBC: 7 10*3/uL (ref 4.0–10.5)
nRBC: 0 % (ref 0.0–0.2)

## 2019-05-17 LAB — SARS CORONAVIRUS 2 (TAT 6-24 HRS): SARS Coronavirus 2: NEGATIVE

## 2019-05-21 ENCOUNTER — Ambulatory Visit: Payer: Managed Care, Other (non HMO) | Admitting: Registered Nurse

## 2019-05-21 ENCOUNTER — Ambulatory Visit
Admission: RE | Admit: 2019-05-21 | Discharge: 2019-05-21 | Disposition: A | Discharge status: Home / Self Care | Payer: Managed Care, Other (non HMO) | Attending: Obstetrics and Gynecology | Admitting: Obstetrics and Gynecology

## 2019-05-21 ENCOUNTER — Encounter
Admission: RE | Disposition: A | Discharge status: Home / Self Care | Payer: Self-pay | Source: Home / Self Care | Attending: Obstetrics and Gynecology

## 2019-05-21 ENCOUNTER — Other Ambulatory Visit: Payer: Self-pay

## 2019-05-21 ENCOUNTER — Encounter: Payer: Self-pay | Admitting: Emergency Medicine

## 2019-05-21 DIAGNOSIS — N8111 Cystocele, midline: Secondary | ICD-10-CM | POA: Diagnosis not present

## 2019-05-21 DIAGNOSIS — N8110 Cystocele, unspecified: Secondary | ICD-10-CM | POA: Diagnosis present

## 2019-05-21 DIAGNOSIS — N816 Rectocele: Secondary | ICD-10-CM | POA: Diagnosis not present

## 2019-05-21 DIAGNOSIS — Z79899 Other long term (current) drug therapy: Secondary | ICD-10-CM | POA: Diagnosis not present

## 2019-05-21 DIAGNOSIS — N8189 Other female genital prolapse: Secondary | ICD-10-CM | POA: Diagnosis not present

## 2019-05-21 HISTORY — PX: ANTERIOR AND POSTERIOR REPAIR: SHX5121

## 2019-05-21 LAB — ABO/RH: ABO/RH(D): A POS

## 2019-05-21 SURGERY — ANTERIOR (CYSTOCELE) AND POSTERIOR REPAIR (RECTOCELE)
Anesthesia: General

## 2019-05-21 MED ORDER — CEFAZOLIN SODIUM-DEXTROSE 2-4 GM/100ML-% IV SOLN
INTRAVENOUS | Status: AC
  Filled 2019-05-21: qty 100

## 2019-05-21 MED ORDER — DEXAMETHASONE SODIUM PHOSPHATE 10 MG/ML IJ SOLN
INTRAMUSCULAR | Status: DC | PRN
  Administered 2019-05-21: 10 mg via INTRAVENOUS

## 2019-05-21 MED ORDER — DOCUSATE SODIUM 100 MG PO CAPS: 100.0000 mg | ORAL_CAPSULE | Freq: Every day | ORAL | 2 refills | Status: DC | PRN

## 2019-05-21 MED ORDER — LACTATED RINGERS IV SOLN: INTRAVENOUS | Status: DC

## 2019-05-21 MED ORDER — LACTATED RINGERS IV SOLN
INTRAVENOUS | Status: DC
  Administered 2019-05-21: 09:00:00 via INTRAVENOUS

## 2019-05-21 MED ORDER — LIDOCAINE-EPINEPHRINE 1 %-1:100000 IJ SOLN
INTRAMUSCULAR | Status: AC
  Filled 2019-05-21: qty 1

## 2019-05-21 MED ORDER — FAMOTIDINE 20 MG PO TABS
ORAL_TABLET | ORAL | Status: AC
  Administered 2019-05-21: 09:00:00 20 mg via ORAL
  Filled 2019-05-21: qty 1

## 2019-05-21 MED ORDER — FAMOTIDINE 20 MG PO TABS
20.0000 mg | ORAL_TABLET | Freq: Once | ORAL | Status: AC
  Administered 2019-05-21: 09:00:00 20 mg via ORAL

## 2019-05-21 MED ORDER — ONDANSETRON HCL 4 MG/2ML IJ SOLN
INTRAMUSCULAR | Status: AC
  Filled 2019-05-21: qty 2

## 2019-05-21 MED ORDER — ACETAMINOPHEN 500 MG PO TABS
ORAL_TABLET | ORAL | Status: AC
  Administered 2019-05-21: 09:00:00 1000 mg via ORAL
  Filled 2019-05-21: qty 2

## 2019-05-21 MED ORDER — GABAPENTIN 300 MG PO CAPS
ORAL_CAPSULE | ORAL | Status: AC
  Administered 2019-05-21: 09:00:00 300 mg via ORAL
  Filled 2019-05-21: qty 1

## 2019-05-21 MED ORDER — SUGAMMADEX SODIUM 200 MG/2ML IV SOLN
INTRAVENOUS | Status: AC
  Filled 2019-05-21: qty 2

## 2019-05-21 MED ORDER — MIDAZOLAM HCL 2 MG/2ML IJ SOLN
INTRAMUSCULAR | Status: DC | PRN
  Administered 2019-05-21: 2 mg via INTRAVENOUS

## 2019-05-21 MED ORDER — SUGAMMADEX SODIUM 200 MG/2ML IV SOLN
INTRAVENOUS | Status: DC | PRN
  Administered 2019-05-21: 200 mg via INTRAVENOUS

## 2019-05-21 MED ORDER — SUCCINYLCHOLINE CHLORIDE 20 MG/ML IJ SOLN
INTRAMUSCULAR | Status: AC
  Filled 2019-05-21: qty 1

## 2019-05-21 MED ORDER — PROPOFOL 10 MG/ML IV BOLUS
INTRAVENOUS | Status: DC | PRN
  Administered 2019-05-21: 150 mg via INTRAVENOUS

## 2019-05-21 MED ORDER — ACETAMINOPHEN 500 MG PO TABS
1000.0000 mg | ORAL_TABLET | ORAL | Status: AC
  Administered 2019-05-21: 09:00:00 1000 mg via ORAL

## 2019-05-21 MED ORDER — FENTANYL CITRATE (PF) 100 MCG/2ML IJ SOLN
INTRAMUSCULAR | Status: AC
  Filled 2019-05-21: qty 2

## 2019-05-21 MED ORDER — SILVER NITRATE-POT NITRATE 75-25 % EX MISC
CUTANEOUS | Status: AC
  Filled 2019-05-21: qty 1

## 2019-05-21 MED ORDER — ESTROGENS, CONJUGATED 0.625 MG/GM VA CREA
TOPICAL_CREAM | VAGINAL | Status: AC
  Filled 2019-05-21: qty 30

## 2019-05-21 MED ORDER — ONDANSETRON HCL 4 MG PO TABS: 4.0000 mg | ORAL_TABLET | Freq: Every day | ORAL | 1 refills | Status: DC | PRN

## 2019-05-21 MED ORDER — ONDANSETRON HCL 4 MG/2ML IJ SOLN
INTRAMUSCULAR | Status: DC | PRN
  Administered 2019-05-21: 4 mg via INTRAVENOUS

## 2019-05-21 MED ORDER — LIDOCAINE-EPINEPHRINE 1 %-1:100000 IJ SOLN
INTRAMUSCULAR | Status: DC | PRN
  Administered 2019-05-21: 5 mL
  Administered 2019-05-21: 7 mL

## 2019-05-21 MED ORDER — CEFAZOLIN SODIUM-DEXTROSE 2-4 GM/100ML-% IV SOLN
2.0000 g | Freq: Once | INTRAVENOUS | Status: AC
  Administered 2019-05-21: 2 g via INTRAVENOUS

## 2019-05-21 MED ORDER — ONDANSETRON HCL 4 MG/2ML IJ SOLN: 4.0000 mg | Freq: Once | INTRAMUSCULAR | Status: DC | PRN

## 2019-05-21 MED ORDER — PROPOFOL 10 MG/ML IV BOLUS
INTRAVENOUS | Status: AC
  Filled 2019-05-21: qty 20

## 2019-05-21 MED ORDER — GABAPENTIN 300 MG PO CAPS
300.0000 mg | ORAL_CAPSULE | ORAL | Status: AC
  Administered 2019-05-21: 09:00:00 300 mg via ORAL

## 2019-05-21 MED ORDER — HYDROCODONE-ACETAMINOPHEN 5-325 MG PO TABS: 1.0000 | ORAL_TABLET | ORAL | 0 refills | Status: DC | PRN

## 2019-05-21 MED ORDER — FENTANYL CITRATE (PF) 100 MCG/2ML IJ SOLN: 25.0000 ug | INTRAMUSCULAR | Status: DC | PRN

## 2019-05-21 MED ORDER — FENTANYL CITRATE (PF) 100 MCG/2ML IJ SOLN
INTRAMUSCULAR | Status: DC | PRN
  Administered 2019-05-21: 25 ug via INTRAVENOUS
  Administered 2019-05-21: 75 ug via INTRAVENOUS

## 2019-05-21 MED ORDER — LIDOCAINE HCL (CARDIAC) PF 100 MG/5ML IV SOSY
PREFILLED_SYRINGE | INTRAVENOUS | Status: DC | PRN
  Administered 2019-05-21: 60 mg via INTRAVENOUS

## 2019-05-21 MED ORDER — LIDOCAINE HCL (PF) 2 % IJ SOLN
INTRAMUSCULAR | Status: AC
  Filled 2019-05-21: qty 10

## 2019-05-21 MED ORDER — MIDAZOLAM HCL 2 MG/2ML IJ SOLN
INTRAMUSCULAR | Status: AC
  Filled 2019-05-21: qty 2

## 2019-05-21 MED ORDER — IBUPROFEN 800 MG PO TABS: 800.0000 mg | ORAL_TABLET | Freq: Three times a day (TID) | ORAL | 0 refills | Status: DC | PRN

## 2019-05-21 MED ORDER — DEXAMETHASONE SODIUM PHOSPHATE 10 MG/ML IJ SOLN
INTRAMUSCULAR | Status: AC
  Filled 2019-05-21: qty 1

## 2019-05-21 MED ORDER — ROCURONIUM BROMIDE 100 MG/10ML IV SOLN
INTRAVENOUS | Status: DC | PRN
  Administered 2019-05-21: 40 mg via INTRAVENOUS

## 2019-05-21 SURGICAL SUPPLY — 39 items
BAG URINE DRAINAGE (UROLOGICAL SUPPLIES) ×3 IMPLANT
BLADE SURG 15 STRL LF DISP TIS (BLADE) ×1 IMPLANT
BLADE SURG 15 STRL SS (BLADE) ×2
CANISTER SUCT 1200ML W/VALVE (MISCELLANEOUS) ×3 IMPLANT
CATH FOLEY 2WAY  5CC 16FR (CATHETERS) ×2
CATH ROBINSON RED A/P 16FR (CATHETERS) ×3 IMPLANT
CATH URTH 16FR FL 2W BLN LF (CATHETERS) ×1 IMPLANT
COVER WAND RF STERILE (DRAPES) IMPLANT
DRAPE PERI LITHO V/GYN (MISCELLANEOUS) ×3 IMPLANT
DRAPE SURG 17X11 SM STRL (DRAPES) ×3 IMPLANT
DRAPE UNDER BUTTOCK W/FLU (DRAPES) ×3 IMPLANT
ELECT REM PT RETURN 9FT ADLT (ELECTROSURGICAL) ×3
ELECTRODE REM PT RTRN 9FT ADLT (ELECTROSURGICAL) ×1 IMPLANT
GAUZE 4X4 16PLY RFD (DISPOSABLE) ×3 IMPLANT
GAUZE PACK 2X3YD (GAUZE/BANDAGES/DRESSINGS) ×3 IMPLANT
GLOVE BIO SURGEON STRL SZ 6.5 (GLOVE) ×4 IMPLANT
GLOVE BIO SURGEON STRL SZ8 (GLOVE) ×6 IMPLANT
GLOVE BIO SURGEONS STRL SZ 6.5 (GLOVE) ×2
GOWN STRL REUS W/ TWL LRG LVL3 (GOWN DISPOSABLE) ×3 IMPLANT
GOWN STRL REUS W/ TWL XL LVL3 (GOWN DISPOSABLE) ×1 IMPLANT
GOWN STRL REUS W/TWL LRG LVL3 (GOWN DISPOSABLE) ×6
GOWN STRL REUS W/TWL XL LVL3 (GOWN DISPOSABLE) ×2
KIT TURNOVER CYSTO (KITS) ×3 IMPLANT
KIT TURNOVER KIT A (KITS) ×3 IMPLANT
LABEL OR SOLS (LABEL) ×3 IMPLANT
NEEDLE HYPO 22GX1.5 SAFETY (NEEDLE) ×3 IMPLANT
NS IRRIG 500ML POUR BTL (IV SOLUTION) ×3 IMPLANT
PACK BASIN MINOR ARMC (MISCELLANEOUS) ×3 IMPLANT
PAD OB MATERNITY 4.3X12.25 (PERSONAL CARE ITEMS) ×3 IMPLANT
PAD PREP 24X41 OB/GYN DISP (PERSONAL CARE ITEMS) ×3 IMPLANT
SUT PDS AB 2-0 CT1 27 (SUTURE) ×3 IMPLANT
SUT VIC AB 0 CT1 36 (SUTURE) ×3 IMPLANT
SUT VIC AB 2-0 CT1 36 (SUTURE) IMPLANT
SUT VIC AB 2-0 SH 27 (SUTURE) ×18
SUT VIC AB 2-0 SH 27XBRD (SUTURE) ×9 IMPLANT
SUT VIC AB 3-0 SH 27 (SUTURE)
SUT VIC AB 3-0 SH 27X BRD (SUTURE) IMPLANT
SYR 10ML LL (SYRINGE) ×6 IMPLANT
SYR CONTROL 10ML LL (SYRINGE) ×3 IMPLANT

## 2019-05-21 NOTE — Anesthesia Preprocedure Evaluation (Signed)
Anesthesia Evaluation  Patient identified by MRN, date of birth, ID band Patient awake    Reviewed: Allergy & Precautions, H&P , NPO status , Patient's Chart, lab work & pertinent test results, reviewed documented beta blocker date and time   Airway Mallampati: II  TM Distance: >3 FB Neck ROM: full    Dental  (+) Teeth Intact   Pulmonary neg pulmonary ROS,    Pulmonary exam normal        Cardiovascular Exercise Tolerance: Good negative cardio ROS Normal cardiovascular exam Rhythm:regular Rate:Normal     Neuro/Psych  Neuromuscular disease negative psych ROS   GI/Hepatic negative GI ROS, Neg liver ROS,   Endo/Other  negative endocrine ROS  Renal/GU negative Renal ROS  negative genitourinary   Musculoskeletal   Abdominal   Peds  Hematology negative hematology ROS (+)   Anesthesia Other Findings History reviewed. No pertinent past medical history. Past Surgical History: No date: ABDOMINAL HYSTERECTOMY     Comment:  w/ oophorectomy No date: COLONOSCOPY BMI    Body Mass Index: 26.47 kg/m     Reproductive/Obstetrics negative OB ROS                             Anesthesia Physical Anesthesia Plan  ASA: II  Anesthesia Plan: General ETT   Post-op Pain Management:    Induction:   PONV Risk Score and Plan:   Airway Management Planned:   Additional Equipment:   Intra-op Plan:   Post-operative Plan:   Informed Consent: I have reviewed the patients History and Physical, chart, labs and discussed the procedure including the risks, benefits and alternatives for the proposed anesthesia with the patient or authorized representative who has indicated his/her understanding and acceptance.     Dental Advisory Given  Plan Discussed with: CRNA  Anesthesia Plan Comments:         Anesthesia Quick Evaluation

## 2019-05-21 NOTE — Anesthesia Procedure Notes (Signed)
Procedure Name: Intubation Date/Time: 05/21/2019 11:33 AM Performed by: Philbert Riser, CRNA Pre-anesthesia Checklist: Patient identified, Emergency Drugs available, Suction available, Patient being monitored and Timeout performed Patient Re-evaluated:Patient Re-evaluated prior to induction Oxygen Delivery Method: Circle system utilized and Simple face mask Preoxygenation: Pre-oxygenation with 100% oxygen Induction Type: IV induction Ventilation: Mask ventilation without difficulty Laryngoscope Size: Mac and 3 Grade View: Grade II Tube size: 7.0 mm Number of attempts: 1 Airway Equipment and Method: Stylet Placement Confirmation: ETT inserted through vocal cords under direct vision,  positive ETCO2 and breath sounds checked- equal and bilateral Secured at: 21 cm Tube secured with: Tape Dental Injury: Teeth and Oropharynx as per pre-operative assessment

## 2019-05-21 NOTE — Progress Notes (Signed)
Pt is ready for Anterior and posterior repair . LAbs reviewed /covid neg  All questions answered . All questions answered

## 2019-05-21 NOTE — Anesthesia Post-op Follow-up Note (Signed)
Anesthesia QCDR form completed.        

## 2019-05-21 NOTE — Brief Op Note (Signed)
05/21/2019  1:15 PM  PATIENT:  Valerie [name]  58 y.o. female  PRE-OPERATIVE DIAGNOSIS:  pelvic prolapse Grade 3cystocele , grade 1-2 rectocele POST-OPERATIVE DIAGNOSIS:  pelvic prolapse  PROCEDURE:  Procedure(s): ANTERIOR (CYSTOCELE) AND POSTERIOR REPAIR (RECTOCELE) (N/A)  SURGEON:  Surgeon(s) and Role:    * Schermerhorn, Gwen Her, MD - Primary    * Benjaman Kindler, MD - Assisting  PHYSICIAN ASSISTANT:PA student Cohen    ASSISTANTS: none   ANESTHESIA:   general  EBL:  10 mL , IOF 1000 cc, uo 700cc  BLOOD ADMINISTERED:none  DRAINS: none   LOCAL MEDICATIONS USED:  MARCAINE     SPECIMEN:  No Specimen  DISPOSITION OF SPECIMEN:  N/A  COUNTS:  YES  TOURNIQUET:  * No tourniquets in log *  DICTATION: .Other Dictation: Dictation Number verbal  PLAN OF CARE: Discharge to home after PACU  PATIENT DISPOSITION:  PACU - hemodynamically stable.   Delay start of Pharmacological VTE agent (>24hrs) due to surgical blood loss or risk of bleeding: not applicable

## 2019-05-21 NOTE — Transfer of Care (Signed)
Immediate Anesthesia Transfer of Care Note  Patient: Valerie [name]  Procedure(s) Performed: ANTERIOR (CYSTOCELE) AND POSTERIOR REPAIR (RECTOCELE) (N/A )  Patient Location: PACU  Anesthesia Type:General  Level of Consciousness: sedated  Airway & Oxygen Therapy: Patient Spontanous Breathing and Patient connected to face mask oxygen  Post-op Assessment: Report given to RN and Post -op Vital signs reviewed and stable  Post vital signs: Reviewed and stable  Last Vitals:  Vitals Value Taken Time  BP    Temp    Pulse 93 05/21/19 1330  Resp 14 05/21/19 1330  SpO2 100 % 05/21/19 1330  Vitals shown include unvalidated device data.  Last Pain:  Vitals:   05/21/19 0846  PainSc: 0-No pain         Complications: No apparent anesthesia complications

## 2019-05-21 NOTE — Op Note (Signed)
NAME: [name], [name] MEDICAL RECORD [id number] ACCOUNT [id number] DATE OF BIRTH:09-17-1961 FACILITY: [facility] LOCATION: [facility] PHYSICIAN:Merlin Ege Josefine Class, MD  OPERATIVE REPORT  DATE OF PROCEDURE:  05/21/2019  PREOPERATIVE DIAGNOSES: 1.  Grade III cystocele. 2.  Grade II rectocele.  PROCEDURE: 1.  Anterior and posterior colporrhaphy. 2.  Perineoplasty.  SURGEON:  Laverta Baltimore, MD  FIRST ASSISTANT:  Leafy Ro, MD  SECOND ASSISTANT:  PA student, Cohen  INDICATIONS:  This is a 58 year old gravida 2, para 2 patient with a prior vaginal hysterectomy for uterine descensus.  The patient has been complaining of pelvic pressure and falling tissues.  DESCRIPTION OF PROCEDURE:  After adequate general endotracheal anesthesia, the patient was placed in dorsal supine position, legs in the candy-cane stirrups.  Vagina, perineum, and lower abdomen were prepped and draped in normal sterile fashion.  The  patient did undergo a time-out.  The patient did receive 2 g IV Ancef prior to commencement of the case.  Attention was directed to the patient's vaginal vault, which was grasped with 2 Allis clamps, and the midline of the anterior portion of the vagina  was injected with 1% lidocaine with 1:100,000 epinephrine.  The incision line was from the vaginal cuff to 1 cm inferior to the urethral meatus.  The vaginal cuff was then incised with a scalpel approximately 1 cm in length.  The vaginal epithelium was  then bluntly dissected from the bladder with Metzenbaum scissors.  Curved clamps were used to hold the vaginal epithelium as the dissection ensued.  The endopelvic fascia with bladder were then dissected from the subvaginal epithelium.  Once adequate  reduction of the tissues was performed, the repair took place.  A 2-0 Vicryl suture was used to incorporate healthier endopelvic fascial tissue, and this was used to reduce the defect.  Six interrupted horizontal mattress sutures  were used.  Good repair  of the defect.  The vaginal tissues were trimmed, and the vaginal epithelium was then closed with 0 Vicryl suture in a running nonlocking fashion.  Of note, the bladder was drained prior to the performance of the anterior colporrhaphy.  Attention was  then directed to the posterior vaginal epithelium, which again was grasped at the hymenal ring with 2 Allis clamps.  The vaginal epithelium was injected centrally from the hymenal ring to approximately 4 cm towards the vaginal cuff.  The vaginal  epithelium was then dissected with Metzenbaum scissors, opened, and the endopelvic tissues were then rolled and dissected off of the subvaginal tissues.  This was accomplished with sharp and blunt dissection.  Again, the defect was repaired with 2-0  Vicryl mattress sutures incorporating a healthier lateral endopelvic fascia and reduction of the rectocele.  A good repair was noted.  Vaginal tissues were trimmed and closed with 0 Vicryl suture.  A wedge-shaped triangle of tissue was removed from the  perineal body.  The perineal body was then incorporated with a crown suture in the standard perineoplasty fashion with tightening of the muscles.  The skin was reapproximated with a 3-0 Vicryl suture subcuticular stitch.  There was minimal bleeding  during the procedure.  Good hemostasis was noted.    ESTIMATED BLOOD LOSS:  10 mL.  INTRAOPERATIVE FLUIDS:  1000 mL.  URINE OUTPUT:  Via straight catheterization at the beginning of the case, 700 mL.  DISPOSITION:  The patient was taken to recovery room in good condition.  LN/NUANCE  D:05/21/2019 T:05/21/2019 JOB:[id number]

## 2019-05-21 NOTE — Discharge Instructions (Signed)

## 2019-05-21 NOTE — Progress Notes (Signed)
Pt voided  300cc with less than 1 cc on bladder scanner, Dr Ouida Sills aware and oked pt to go home

## 2019-05-22 ENCOUNTER — Encounter: Payer: Self-pay | Admitting: Obstetrics and Gynecology

## 2019-05-22 NOTE — Anesthesia Postprocedure Evaluation (Signed)
Anesthesia Post Note  Patient: Valerie [name]  Procedure(s) Performed: ANTERIOR (CYSTOCELE) AND POSTERIOR REPAIR (RECTOCELE) (N/A )  Patient location during evaluation: PACU Anesthesia Type: General Level of consciousness: awake and alert Pain management: pain level controlled Vital Signs Assessment: post-procedure vital signs reviewed and stable Respiratory status: spontaneous breathing, nonlabored ventilation, respiratory function stable and patient connected to nasal cannula oxygen Cardiovascular status: blood pressure returned to baseline and stable Postop Assessment: no apparent nausea or vomiting Anesthetic complications: no     Last Vitals:  Vitals:   05/21/19 1424 05/21/19 1551  BP: 124/66 130/71  Pulse: 82 74  Resp: 20 18  Temp: (!) 36.1 C   SpO2: 100% 100%    Last Pain:  Vitals:   05/21/19 1424  TempSrc: Temporal  PainSc: 0-No pain                 Molli Barrows

## 2019-05-30 NOTE — Addendum Note (Signed)
Addendum  created 05/30/19 0920 by Daven Montz, CRNA   Charge Capture section accepted    

## 2019-06-26 ENCOUNTER — Encounter: Payer: Self-pay | Admitting: Adult Health

## 2019-07-10 ENCOUNTER — Ambulatory Visit: Payer: Managed Care, Other (non HMO)

## 2019-07-10 DIAGNOSIS — Z0189 Encounter for other specified special examinations: Secondary | ICD-10-CM

## 2019-07-10 DIAGNOSIS — Z008 Encounter for other general examination: Secondary | ICD-10-CM

## 2019-07-10 NOTE — Progress Notes (Signed)
Was seen at her PCP and had her physical there. Did not have to see provider.

## 2019-07-10 NOTE — Progress Notes (Signed)
No provider contact- declined visit as she had full physical and labs with her Schermerhorn, Gwen Her, MD PCP this morning.  Had biometric screening only with Fernando Salinas. No biometric physical needed.

## 2019-10-09 ENCOUNTER — Other Ambulatory Visit: Payer: Self-pay

## 2019-10-09 ENCOUNTER — Other Ambulatory Visit: Payer: Managed Care, Other (non HMO)

## 2019-10-09 ENCOUNTER — Other Ambulatory Visit: Payer: Self-pay | Admitting: Family Medicine

## 2019-10-09 DIAGNOSIS — N3281 Overactive bladder: Secondary | ICD-10-CM

## 2019-11-01 LAB — URINE CULTURE

## 2020-03-26 ENCOUNTER — Ambulatory Visit: Payer: Managed Care, Other (non HMO) | Admitting: Nurse Practitioner

## 2020-03-26 ENCOUNTER — Encounter: Payer: Self-pay | Admitting: Nurse Practitioner

## 2020-03-26 ENCOUNTER — Other Ambulatory Visit: Payer: Self-pay

## 2020-03-26 VITALS — BP 124/64 | HR 88 | Resp 16 | Ht 66.0 in | Wt 161.0 lb

## 2020-03-26 DIAGNOSIS — Z008 Encounter for other general examination: Secondary | ICD-10-CM

## 2020-03-26 NOTE — Patient Instructions (Signed)
Health Maintenance, Female Adopting a healthy lifestyle and getting preventive care are important in promoting health and wellness. Ask your health care provider about:  The right schedule for you to have regular tests and exams.  Things you can do on your own to prevent diseases and keep yourself healthy. What should I know about diet, weight, and exercise? Eat a healthy diet   Eat a diet that includes plenty of vegetables, fruits, low-fat dairy products, and lean protein.  Do not eat a lot of foods that are high in solid fats, added sugars, or sodium. Maintain a healthy weight Body mass index (BMI) is used to identify weight problems. It estimates body fat based on height and weight. Your health care provider can help determine your BMI and help you achieve or maintain a healthy weight. Get regular exercise Get regular exercise. This is one of the most important things you can do for your health. Most adults should:  Exercise for at least 150 minutes each week. The exercise should increase your heart rate and make you sweat (moderate-intensity exercise).  Do strengthening exercises at least twice a week. This is in addition to the moderate-intensity exercise.  Spend less time sitting. Even light physical activity can be beneficial. Watch cholesterol and blood lipids Have your blood tested for lipids and cholesterol at 58 years of age, then have this test every 5 years. Have your cholesterol levels checked more often if:  Your lipid or cholesterol levels are high.  You are older than 58 years of age.  You are at high risk for heart disease. What should I know about cancer screening? Depending on your health history and family history, you may need to have cancer screening at various ages. This may include screening for:  Breast cancer.  Cervical cancer.  Colorectal cancer.  Skin cancer.  Lung cancer. What should I know about heart disease, diabetes, and high blood  pressure? Blood pressure and heart disease  High blood pressure causes heart disease and increases the risk of stroke. This is more likely to develop in people who have high blood pressure readings, are of African descent, or are overweight.  Have your blood pressure checked: ? Every 3-5 years if you are 18-39 years of age. ? Every year if you are 40 years old or older. Diabetes Have regular diabetes screenings. This checks your fasting blood sugar level. Have the screening done:  Once every three years after age 40 if you are at a normal weight and have a low risk for diabetes.  More often and at a younger age if you are overweight or have a high risk for diabetes. What should I know about preventing infection? Hepatitis B If you have a higher risk for hepatitis B, you should be screened for this virus. Talk with your health care provider to find out if you are at risk for hepatitis B infection. Hepatitis C Testing is recommended for:  Everyone born from 1945 through 1965.  Anyone with known risk factors for hepatitis C. Sexually transmitted infections (STIs)  Get screened for STIs, including gonorrhea and chlamydia, if: ? You are sexually active and are younger than 58 years of age. ? You are older than 58 years of age and your health care provider tells you that you are at risk for this type of infection. ? Your sexual activity has changed since you were last screened, and you are at increased risk for chlamydia or gonorrhea. Ask your health care provider if   you are at risk.  Ask your health care provider about whether you are at high risk for HIV. Your health care provider may recommend a prescription medicine to help prevent HIV infection. If you choose to take medicine to prevent HIV, you should first get tested for HIV. You should then be tested every 3 months for as long as you are taking the medicine. Pregnancy  If you are about to stop having your period (premenopausal) and  you may become pregnant, seek counseling before you get pregnant.  Take 400 to 800 micrograms (mcg) of folic acid every day if you become pregnant.  Ask for birth control (contraception) if you want to prevent pregnancy. Osteoporosis and menopause Osteoporosis is a disease in which the bones lose minerals and strength with aging. This can result in bone fractures. If you are 65 years old or older, or if you are at risk for osteoporosis and fractures, ask your health care provider if you should:  Be screened for bone loss.  Take a calcium or vitamin D supplement to lower your risk of fractures.  Be given hormone replacement therapy (HRT) to treat symptoms of menopause. Follow these instructions at home: Lifestyle  Do not use any products that contain nicotine or tobacco, such as cigarettes, e-cigarettes, and chewing tobacco. If you need help quitting, ask your health care provider.  Do not use street drugs.  Do not share needles.  Ask your health care provider for help if you need support or information about quitting drugs. Alcohol use  Do not drink alcohol if: ? Your health care provider tells you not to drink. ? You are pregnant, may be pregnant, or are planning to become pregnant.  If you drink alcohol: ? Limit how much you use to 0-1 drink a day. ? Limit intake if you are breastfeeding.  Be aware of how much alcohol is in your drink. In the [place] one drink equals one 12 oz bottle of beer (355 mL), one 5 oz glass of wine (148 mL), or one 1 oz glass of hard liquor (44 mL). General instructions  Schedule regular health, dental, and eye exams.  Stay current with your vaccines.  Tell your health care provider if: ? You often feel depressed. ? You have ever been abused or do not feel safe at home. Summary  Adopting a healthy lifestyle and getting preventive care are important in promoting health and wellness.  Follow your health care provider's instructions about healthy  diet, exercising, and getting tested or screened for diseases.  Follow your health care provider's instructions on monitoring your cholesterol and blood pressure. This information is not intended to replace advice given to you by your health care provider. Make sure you discuss any questions you have with your health care provider. Document Revised: 09/20/2018 Document Reviewed: 09/20/2018 Elsevier Patient Education  2020 [employer]  

## 2020-03-26 NOTE — Progress Notes (Signed)
Subjective:     Patient ID: Valerie [name], female   DOB: 23-Aug-1961, 59 y.o.   MRN: [id number]  HPI Valerie [name] is a very plesant 59 y.o. female who presents to the [facility] for her yearly biometric screening exam. 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 She is content with her job. She does a lot of walking and keeps count of her steps daily. She eats a healthy diet that consist of a lot of salads. She denies any health problems or concerns today.   PMH: Uterovaginal prolapse  Surg: abdominal hysterectomy, anterior and posterior repair, Colonscopy  SH: No tobacco, ETOH or drug use Meds: Lipitor  Allergies: Metronidazole  Review of Systems Employee denies any problems today.    Objective:BP 124/64 (BP Location: Right Arm, Patient Position: Sitting, Cuff Size: Normal)   Pulse 88   Resp 16   Ht 5\' 6"  (1.676 m)   Wt 161 lb (73 kg)   BMI 25.99 kg/m     Physical Exam Constitutional:      Appearance: Normal appearance.  HENT:     Head: Normocephalic and atraumatic.     Right Ear: Tympanic membrane and ear canal normal.     Left Ear: Tympanic membrane and ear canal normal.     Nose: Nose normal.     Mouth/Throat:     Mouth: Mucous membranes are moist.  Eyes:     Extraocular Movements: Extraocular movements intact.     Conjunctiva/sclera: Conjunctivae normal.  Neck:     Thyroid: No thyroid mass or thyroid tenderness.     Vascular: Normal carotid pulses. No carotid bruit or JVD.     Trachea: Trachea normal.  Cardiovascular:     Rate and Rhythm: Normal rate and regular rhythm.  Pulmonary:     Effort: Pulmonary effort is normal.     Breath sounds: Normal breath sounds and air entry.  Abdominal:     Palpations: Abdomen is soft.     Tenderness: There is no abdominal tenderness.  Musculoskeletal:     Cervical back: Normal range of motion.  Lymphadenopathy:     Cervical: No cervical adenopathy.  Skin:     General: Skin is warm and dry.  Neurological:     General: No focal deficit present.     Mental Status: She is alert.     Cranial Nerves: Cranial nerves are intact.     Sensory: Sensation is intact.     Motor: Motor function is intact.     Coordination: Romberg sign negative.     Gait: Gait is intact.     Deep Tendon Reflexes:     Reflex Scores:      Bicep reflexes are 2+ on the right side and 2+ on the left side.      Brachioradialis reflexes are 2+ on the right side and 2+ on the left side.      Patellar reflexes are 2+ on the right side and 2+ on the left side.    Comments: Ambulatory with steady gait, stands on one foot without difficulty. Grips are equal, radial pulses 2+.   Psychiatric:        Attention and Perception: Attention normal.        Mood and Affect: Mood normal.        Speech: Speech normal.        Behavior: Behavior normal.        Assessment:    58  y.o. female here for annual biometric screening exam. Limited physical exam today is normal.      Plan:    Labs pending. Discussed with employee importance of healthy diet and exercise. Employee given opportunity to ask questions. All questions answered and employee voices understanding. D/C instructions given verbally and in writing. Plan to return in one year or sooner for any problems.

## 2020-03-27 LAB — LIPID PANEL
Chol/HDL Ratio: 4 ratio (ref 0.0–4.4)
Cholesterol, Total: 158 mg/dL (ref 100–199)
HDL: 40 mg/dL (ref >39)
LDL Chol Calc (NIH): 98 mg/dL (ref 0–99)
Triglycerides: 110 mg/dL (ref 0–149)
VLDL Cholesterol Cal: 20 mg/dL (ref 5–40)

## 2020-03-27 LAB — GLUCOSE, RANDOM: Glucose: 119 mg/dL — ABNORMAL HIGH (ref 65–99)

## 2020-04-08 ENCOUNTER — Encounter: Payer: Self-pay | Admitting: Nurse Practitioner

## 2020-05-19 ENCOUNTER — Other Ambulatory Visit: Payer: Self-pay | Admitting: Obstetrics and Gynecology

## 2020-05-19 DIAGNOSIS — Z1231 Encounter for screening mammogram for malignant neoplasm of breast: Secondary | ICD-10-CM

## 2020-06-04 ENCOUNTER — Ambulatory Visit
Admission: RE | Admit: 2020-06-04 | Discharge: 2020-06-04 | Disposition: A | Discharge status: Home / Self Care | Payer: Managed Care, Other (non HMO) | Source: Ambulatory Visit | Attending: Obstetrics and Gynecology | Admitting: Obstetrics and Gynecology

## 2020-06-04 ENCOUNTER — Other Ambulatory Visit: Payer: Self-pay

## 2020-06-04 DIAGNOSIS — Z1231 Encounter for screening mammogram for malignant neoplasm of breast: Secondary | ICD-10-CM | POA: Insufficient documentation

## 2021-01-08 ENCOUNTER — Telehealth: Payer: Managed Care, Other (non HMO) | Admitting: Physician Assistant

## 2021-01-08 ENCOUNTER — Telehealth: Payer: Managed Care, Other (non HMO)

## 2021-01-08 ENCOUNTER — Encounter: Payer: Self-pay | Admitting: Physician Assistant

## 2021-01-08 DIAGNOSIS — J3089 Other allergic rhinitis: Secondary | ICD-10-CM

## 2021-01-08 DIAGNOSIS — R040 Epistaxis: Secondary | ICD-10-CM

## 2021-01-08 NOTE — Progress Notes (Signed)
  Telephone Visit at patient request  Lelon Mast [facility] -  60 yo F  Identified by name and birthday  Responded to request for call back  Limitations of call in visit as opposed to in person evaluation reviewed and patient accepts those limitations and wishes to proceed.  Very pleasant female relates Hx:  Has history of seasonal allergies and cat allergies  Visiting with her son who has a cat often makes her symptomatic  Itchy eyes,some clear discharge and intermittent light bleed spotting on tissue when blowing her nose  Has experienced her usual symptoms on and off over the past few weeks  She recognizes that she forgets her pill relatively frequently  [facility] generic allergy Rx-can't remember name)  No hx of blood pressure issues or trauma, no H/A, dizzines,visual changes recently. No malaise or other distress.  Has noted recent very minimal tissue spotting and was concerned.  Interview reveals that she has missed a number of allergy pills lately because she thought allergy season was leaving.  Current reports support high cedar and high grass pollens locally  She has had return of other usual symptoms-suggestion that this usual presentation for mild nasal bleed is very much like previous episodes- readily received .  Impression-  Seasonal/environmental allergy syndrome, minimal intermittent epistaxis  Plan - Option for patient- Observe a few days prn- either resume allergy Rx or wait out a change in pollen release as desired. To present to clinic if any significant change in presentation or flow or should she have questions or concerns or exacerbation of current or new symptoms.   States pleased with conversation and evaluation-will follow up with concerns should they arise

## 2021-05-06 ENCOUNTER — Other Ambulatory Visit: Payer: Self-pay | Admitting: Obstetrics and Gynecology

## 2021-05-06 DIAGNOSIS — Z1231 Encounter for screening mammogram for malignant neoplasm of breast: Secondary | ICD-10-CM

## 2021-06-29 ENCOUNTER — Other Ambulatory Visit: Payer: Self-pay

## 2021-06-29 ENCOUNTER — Ambulatory Visit
Admission: RE | Admit: 2021-06-29 | Discharge: 2021-06-29 | Disposition: A | Discharge status: Home / Self Care | Payer: Managed Care, Other (non HMO) | Source: Ambulatory Visit | Attending: Obstetrics and Gynecology | Admitting: Obstetrics and Gynecology

## 2021-06-29 DIAGNOSIS — Z1231 Encounter for screening mammogram for malignant neoplasm of breast: Secondary | ICD-10-CM | POA: Insufficient documentation

## 2022-07-05 ENCOUNTER — Other Ambulatory Visit: Payer: Self-pay | Admitting: Obstetrics and Gynecology

## 2022-07-05 DIAGNOSIS — Z1231 Encounter for screening mammogram for malignant neoplasm of breast: Secondary | ICD-10-CM

## 2022-07-26 ENCOUNTER — Ambulatory Visit
Admission: RE | Admit: 2022-07-26 | Discharge: 2022-07-26 | Disposition: A | Discharge status: Home / Self Care | Payer: Managed Care, Other (non HMO) | Source: Ambulatory Visit | Attending: Obstetrics and Gynecology | Admitting: Obstetrics and Gynecology

## 2022-07-26 DIAGNOSIS — Z1231 Encounter for screening mammogram for malignant neoplasm of breast: Secondary | ICD-10-CM | POA: Insufficient documentation

## 2023-06-15 ENCOUNTER — Other Ambulatory Visit: Payer: Self-pay | Admitting: Obstetrics and Gynecology

## 2023-06-15 DIAGNOSIS — Z1231 Encounter for screening mammogram for malignant neoplasm of breast: Secondary | ICD-10-CM

## 2023-08-01 ENCOUNTER — Ambulatory Visit
Admission: RE | Admit: 2023-08-01 | Discharge: 2023-08-01 | Disposition: A | Discharge status: Home / Self Care | Payer: Managed Care, Other (non HMO) | Source: Ambulatory Visit | Attending: Obstetrics and Gynecology | Admitting: Obstetrics and Gynecology

## 2023-08-01 DIAGNOSIS — Z1231 Encounter for screening mammogram for malignant neoplasm of breast: Secondary | ICD-10-CM | POA: Insufficient documentation

## 2024-04-09 ENCOUNTER — Other Ambulatory Visit: Payer: Self-pay | Admitting: Obstetrics and Gynecology

## 2024-04-09 DIAGNOSIS — Z1231 Encounter for screening mammogram for malignant neoplasm of breast: Secondary | ICD-10-CM

## 2024-08-06 ENCOUNTER — Ambulatory Visit
Admission: RE | Admit: 2024-08-06 | Discharge: 2024-08-06 | Disposition: A | Discharge status: Home / Self Care | Source: Ambulatory Visit | Attending: Obstetrics and Gynecology | Admitting: Obstetrics and Gynecology

## 2024-08-06 DIAGNOSIS — Z1231 Encounter for screening mammogram for malignant neoplasm of breast: Secondary | ICD-10-CM | POA: Insufficient documentation
